# Patient Record
Sex: Female | Born: 1945 | Race: White | Hispanic: No | Smoking: Never smoker
Health system: Southern US, Community
[De-identification: ages and names within clinical notes are randomized; demographics above are authoritative.]

## PROBLEM LIST (undated history)

## (undated) DIAGNOSIS — I10 Essential (primary) hypertension: Secondary | ICD-10-CM

## (undated) DIAGNOSIS — G500 Trigeminal neuralgia: Secondary | ICD-10-CM

---

## 2001-06-02 ENCOUNTER — Encounter: Admission: RE | Admit: 2001-06-02 | Discharge: 2001-06-02 | Payer: Self-pay | Admitting: Family Medicine

## 2001-06-13 ENCOUNTER — Encounter: Admission: RE | Admit: 2001-06-13 | Discharge: 2001-06-13 | Payer: Self-pay | Admitting: Family Medicine

## 2001-06-30 ENCOUNTER — Encounter: Admission: RE | Admit: 2001-06-30 | Discharge: 2001-06-30 | Payer: Self-pay | Admitting: Family Medicine

## 2001-06-30 ENCOUNTER — Encounter: Payer: Self-pay | Admitting: Sports Medicine

## 2001-06-30 ENCOUNTER — Encounter: Admission: RE | Admit: 2001-06-30 | Discharge: 2001-06-30 | Payer: Self-pay | Admitting: Sports Medicine

## 2001-07-11 ENCOUNTER — Encounter: Admission: RE | Admit: 2001-07-11 | Discharge: 2001-07-11 | Payer: Self-pay | Admitting: Sports Medicine

## 2001-08-29 ENCOUNTER — Encounter: Admission: RE | Admit: 2001-08-29 | Discharge: 2001-08-29 | Payer: Self-pay | Admitting: Family Medicine

## 2001-09-22 ENCOUNTER — Encounter: Admission: RE | Admit: 2001-09-22 | Discharge: 2001-09-22 | Payer: Self-pay | Admitting: Sports Medicine

## 2001-11-04 ENCOUNTER — Encounter: Admission: RE | Admit: 2001-11-04 | Discharge: 2001-11-04 | Payer: Self-pay | Admitting: Family Medicine

## 2001-12-20 ENCOUNTER — Encounter: Admission: RE | Admit: 2001-12-20 | Discharge: 2001-12-20 | Payer: Self-pay | Admitting: Family Medicine

## 2002-06-23 ENCOUNTER — Encounter: Admission: RE | Admit: 2002-06-23 | Discharge: 2002-06-23 | Payer: Self-pay | Admitting: Family Medicine

## 2002-10-16 ENCOUNTER — Encounter: Admission: RE | Admit: 2002-10-16 | Discharge: 2002-10-16 | Payer: Self-pay | Admitting: Family Medicine

## 2002-11-12 ENCOUNTER — Emergency Department (HOSPITAL_COMMUNITY): Admission: EM | Admit: 2002-11-12 | Discharge: 2002-11-12 | Payer: Self-pay | Admitting: Emergency Medicine

## 2002-11-12 ENCOUNTER — Encounter: Payer: Self-pay | Admitting: Emergency Medicine

## 2002-11-30 ENCOUNTER — Encounter: Admission: RE | Admit: 2002-11-30 | Discharge: 2002-11-30 | Payer: Self-pay | Admitting: Sports Medicine

## 2002-11-30 ENCOUNTER — Ambulatory Visit (HOSPITAL_COMMUNITY): Admission: RE | Admit: 2002-11-30 | Discharge: 2002-11-30 | Payer: Self-pay | Admitting: Sports Medicine

## 2002-12-07 ENCOUNTER — Encounter: Admission: RE | Admit: 2002-12-07 | Discharge: 2002-12-07 | Payer: Self-pay | Admitting: Sports Medicine

## 2003-12-04 ENCOUNTER — Other Ambulatory Visit: Admission: RE | Admit: 2003-12-04 | Discharge: 2003-12-04 | Payer: Self-pay | Admitting: Internal Medicine

## 2004-01-18 ENCOUNTER — Encounter: Admission: RE | Admit: 2004-01-18 | Discharge: 2004-01-18 | Payer: Self-pay | Admitting: Internal Medicine

## 2005-12-15 ENCOUNTER — Encounter: Admission: RE | Admit: 2005-12-15 | Discharge: 2005-12-15 | Payer: Self-pay | Admitting: Internal Medicine

## 2006-01-07 ENCOUNTER — Other Ambulatory Visit: Admission: RE | Admit: 2006-01-07 | Discharge: 2006-01-07 | Payer: Self-pay | Admitting: Internal Medicine

## 2006-12-29 ENCOUNTER — Encounter: Admission: RE | Admit: 2006-12-29 | Discharge: 2006-12-29 | Payer: Self-pay | Admitting: Internal Medicine

## 2007-12-26 ENCOUNTER — Other Ambulatory Visit: Admission: RE | Admit: 2007-12-26 | Discharge: 2007-12-26 | Payer: Self-pay | Admitting: Internal Medicine

## 2008-01-04 ENCOUNTER — Encounter: Admission: RE | Admit: 2008-01-04 | Discharge: 2008-01-04 | Payer: Self-pay | Admitting: Internal Medicine

## 2008-09-20 ENCOUNTER — Observation Stay (HOSPITAL_COMMUNITY): Admission: EM | Admit: 2008-09-20 | Discharge: 2008-09-21 | Payer: Self-pay | Admitting: Emergency Medicine

## 2009-04-03 ENCOUNTER — Encounter: Admission: RE | Admit: 2009-04-03 | Discharge: 2009-04-03 | Payer: Self-pay | Admitting: Neurology

## 2009-05-02 ENCOUNTER — Encounter: Admission: RE | Admit: 2009-05-02 | Discharge: 2009-05-02 | Payer: Self-pay | Admitting: Internal Medicine

## 2009-06-10 ENCOUNTER — Encounter (INDEPENDENT_AMBULATORY_CARE_PROVIDER_SITE_OTHER): Payer: Self-pay | Admitting: General Surgery

## 2009-06-10 ENCOUNTER — Ambulatory Visit (HOSPITAL_COMMUNITY): Admission: RE | Admit: 2009-06-10 | Discharge: 2009-06-11 | Payer: Self-pay | Admitting: General Surgery

## 2009-09-28 ENCOUNTER — Ambulatory Visit (HOSPITAL_COMMUNITY): Admission: RE | Admit: 2009-09-28 | Discharge: 2009-09-28 | Payer: Self-pay | Admitting: Emergency Medicine

## 2009-09-28 ENCOUNTER — Ambulatory Visit: Payer: Self-pay | Admitting: Vascular Surgery

## 2009-09-28 ENCOUNTER — Encounter (INDEPENDENT_AMBULATORY_CARE_PROVIDER_SITE_OTHER): Payer: Self-pay | Admitting: Orthopaedic Surgery

## 2009-12-02 ENCOUNTER — Ambulatory Visit (HOSPITAL_BASED_OUTPATIENT_CLINIC_OR_DEPARTMENT_OTHER): Admission: RE | Admit: 2009-12-02 | Discharge: 2009-12-02 | Payer: Self-pay | Admitting: Orthopedic Surgery

## 2010-05-29 ENCOUNTER — Encounter: Admission: RE | Admit: 2010-05-29 | Discharge: 2010-05-29 | Payer: Self-pay | Admitting: Internal Medicine

## 2010-07-31 ENCOUNTER — Emergency Department (HOSPITAL_COMMUNITY): Admission: EM | Admit: 2010-07-31 | Discharge: 2009-10-10 | Payer: Self-pay | Admitting: Emergency Medicine

## 2010-11-12 LAB — POCT HEMOGLOBIN-HEMACUE: Hemoglobin: 13.1 g/dL (ref 12.0–15.0)

## 2010-11-12 LAB — BASIC METABOLIC PANEL
CO2: 29 mEq/L (ref 19–32)
Chloride: 97 mEq/L (ref 96–112)
GFR calc non Af Amer: >60 mL/min (ref >60)
Glucose, Bld: 102 mg/dL — ABNORMAL HIGH (ref 70–99)
Potassium: 4 mEq/L (ref 3.5–5.1)

## 2010-11-13 LAB — CBC
HCT: 40 % (ref 36.0–46.0)
Hemoglobin: 13.5 g/dL (ref 12.0–15.0)
MCHC: 33.8 g/dL (ref 30.0–36.0)
Platelets: 278 10*3/uL (ref 150–400)
RDW: 16.5 % — ABNORMAL HIGH (ref 11.5–15.5)

## 2010-11-13 LAB — POCT I-STAT, CHEM 8
BUN: 17 mg/dL (ref 6–23)
Creatinine, Ser: 0.6 mg/dL (ref 0.4–1.2)
Glucose, Bld: 123 mg/dL — ABNORMAL HIGH (ref 70–99)
HCT: 42 % (ref 36.0–46.0)
Potassium: 4.1 mEq/L (ref 3.5–5.1)

## 2010-11-13 LAB — DIFFERENTIAL
Basophils Absolute: 0.1 10*3/uL (ref 0.0–0.1)
Basophils Relative: 1 % (ref 0–1)
Eosinophils Absolute: 0.2 10*3/uL (ref 0.0–0.7)
Eosinophils Relative: 1 % (ref 0–5)
Lymphs Abs: 2 10*3/uL (ref 0.7–4.0)
Monocytes Absolute: 0.6 10*3/uL (ref 0.1–1.0)
Monocytes Relative: 4 % (ref 3–12)
Neutro Abs: 11.9 10*3/uL — ABNORMAL HIGH (ref 1.7–7.7)

## 2010-11-13 LAB — HEPATIC FUNCTION PANEL
AST: 63 U/L — ABNORMAL HIGH (ref 0–37)
Bilirubin, Direct: 0.1 mg/dL (ref 0.0–0.3)
Indirect Bilirubin: 0.6 mg/dL (ref 0.3–0.9)
Total Protein: 7.1 g/dL (ref 6.0–8.3)

## 2010-11-13 LAB — URINE CULTURE

## 2010-11-13 LAB — URINALYSIS, ROUTINE W REFLEX MICROSCOPIC
Hgb urine dipstick: NEGATIVE
Specific Gravity, Urine: 1.012 (ref 1.005–1.030)
pH: 7.5 (ref 5.0–8.0)

## 2010-11-13 LAB — URINE MICROSCOPIC-ADD ON

## 2010-11-27 LAB — URINALYSIS, ROUTINE W REFLEX MICROSCOPIC
Glucose, UA: NEGATIVE mg/dL
Ketones, ur: NEGATIVE mg/dL
Protein, ur: NEGATIVE mg/dL
Specific Gravity, Urine: 1.019 (ref 1.005–1.030)
Urobilinogen, UA: 1 mg/dL (ref 0.0–1.0)
pH: 6.5 (ref 5.0–8.0)

## 2010-11-27 LAB — COMPREHENSIVE METABOLIC PANEL
Albumin: 4 g/dL (ref 3.5–5.2)
Alkaline Phosphatase: 109 U/L (ref 39–117)
CO2: 28 mEq/L (ref 19–32)
Creatinine, Ser: 0.89 mg/dL (ref 0.4–1.2)
GFR calc non Af Amer: >60 mL/min (ref >60)
Total Protein: 7.1 g/dL (ref 6.0–8.3)

## 2010-11-27 LAB — DIFFERENTIAL
Basophils Absolute: 0.1 10*3/uL (ref 0.0–0.1)
Basophils Relative: 1 % (ref 0–1)
Eosinophils Absolute: 0.3 10*3/uL (ref 0.0–0.7)
Eosinophils Relative: 3 % (ref 0–5)
Lymphocytes Relative: 31 % (ref 12–46)
Lymphs Abs: 3.3 10*3/uL (ref 0.7–4.0)

## 2010-11-27 LAB — CBC
Hemoglobin: 13.3 g/dL (ref 12.0–15.0)
WBC: 10.4 10*3/uL (ref 4.0–10.5)

## 2010-11-27 LAB — URINE MICROSCOPIC-ADD ON

## 2010-12-08 LAB — CARDIAC PANEL(CRET KIN+CKTOT+MB+TROPI)
CK, MB: 1.9 ng/mL (ref 0.3–4.0)
CK, MB: 2.1 ng/mL (ref 0.3–4.0)
Relative Index: INVALID (ref 0.0–2.5)
Total CK: 59 U/L (ref 7–177)
Total CK: 64 U/L (ref 7–177)
Troponin I: 0.01 ng/mL (ref 0.00–0.06)

## 2010-12-08 LAB — CBC
HCT: 37.2 % (ref 36.0–46.0)
MCHC: 33.5 g/dL (ref 30.0–36.0)
Platelets: 276 10*3/uL (ref 150–400)
RDW: 15.7 % — ABNORMAL HIGH (ref 11.5–15.5)

## 2010-12-08 LAB — DIFFERENTIAL
Basophils Relative: 1 % (ref 0–1)
Lymphs Abs: 1.6 10*3/uL (ref 0.7–4.0)
Monocytes Absolute: 0.5 10*3/uL (ref 0.1–1.0)
Monocytes Relative: 6 % (ref 3–12)
Neutro Abs: 5.6 10*3/uL (ref 1.7–7.7)
Neutrophils Relative %: 69 % (ref 43–77)

## 2010-12-08 LAB — POCT CARDIAC MARKERS
CKMB, poc: <1 ng/mL — ABNORMAL LOW (ref 1.0–8.0)
Myoglobin, poc: 69.3 ng/mL (ref 12–200)
Troponin i, poc: <0.05 ng/mL (ref 0.00–0.09)
Troponin i, poc: <0.05 ng/mL (ref 0.00–0.09)

## 2010-12-08 LAB — COMPREHENSIVE METABOLIC PANEL
Albumin: 3.5 g/dL (ref 3.5–5.2)
Alkaline Phosphatase: 111 U/L (ref 39–117)
BUN: 12 mg/dL (ref 6–23)
Calcium: 9 mg/dL (ref 8.4–10.5)
Glucose, Bld: 139 mg/dL — ABNORMAL HIGH (ref 70–99)
Potassium: 4.4 mEq/L (ref 3.5–5.1)
Sodium: 138 mEq/L (ref 135–145)
Total Protein: 6.8 g/dL (ref 6.0–8.3)

## 2010-12-08 LAB — LIPASE, BLOOD: Lipase: 32 U/L (ref 11–59)

## 2011-01-06 NOTE — H&P (Signed)
NAME:  Shelley Barrera, Shelley Barrera NO.:  [id number]   MEDICAL RECORD NO.:  [id number]          PATIENT TYPE:  EMS   LOCATION:  [facility]                         FACILITY:  [facility]   PHYSICIAN:  Hollice Espy, M.D.DATE OF BIRTH:  27-Mar-1946   DATE OF ADMISSION:  09/20/2008  DATE OF DISCHARGE:                              HISTORY & PHYSICAL   The patient's PCP is Dr. Georgann Housekeeper.   CONSULTANTS:  [facility].   CHIEF COMPLAINTS:  Chest pain.   HISTORY OF PRESENT ILLNESS:  This patient is a 65 year old white female  with a past medical history of hypertension and depression who had been  complaining of previous episodes of chest pain and had been seen by her  PCP. At that time she had been scheduled for an outpatient stress test.  She completed part 1 of her nuclear stress test on September 19, 2008.  However, in the interim she had plans for part 2 of her stress test to  be on September 21, 2008, but this part was rescheduled secondary to  possible weather conditions. In the interim she developed some chest  pain, left-sided with some radiation to her back, associated shortness  of breath and weakness.  She became concerned and came into the  emergency room. In the emergency room she was noted to have normal  cardiac markers, normal EKG, negative D-dimer, normal lab values. Chest  x-ray done showed cardiomegaly but no active lung disease.  The patient  was given aspirin and nitroglycerin.  She says her pain is intermittent  although now it is more on the right side, and she felt that perhaps it  was brought on by eating a sandwich.  She denies any headaches, vision  changes, dysphagia. No palpitations or shortness breath, wheeze, cough,  abdominal pain, hematuria, dysuria, constipation, diarrhea, focal  extremity numbness, weakness, or pain.   REVIEW OF SYSTEMS:  Otherwise negative.   The patient's past medical history includes seasonal rhinitis,  depression,  hypertension.   MEDICATIONS:  She is on Diovan 160, Cymbalta 60, Wellbutrin 300, Zyrtec  10, and Trileptal.   ALLERGIES:  She has allergies to DEMEROL.   SOCIAL HISTORY:  She denies any tobacco, alcohol or drug use.   FAMILY HISTORY:  Noncontributory.   PHYSICAL EXAMINATION:  VITALS:  On admission temperature 97.9, heart  rate 82, blood pressure 149/70, respirations 16, O2 sat 100% on 2  liters.  GENERAL:  She is alert and oriented x3, in no apparent distress.  HEENT:  Normocephalic, atraumatic.  Mucous membranes are moist.  She has  no carotid bruits.  HEART:  Regular rate and rhythm, S1 and S2 with 2/6 systolic ejection  murmur.  LUNGS:  Clear to auscultation bilaterally.  ABDOMEN:  Soft, nontender, nondistended.  Positive bowel sounds.  EXTREMITIES:  No clubbing, cyanosis or edema.   LABS:  Chest x-ray is as per HPI.  White count 8.1, H and H 12.4 and  37.2, MCV 82, platelet count 276, no shift. Sodium 138, potassium 4.4,  chloride 102, bicarb 29, BUN 12, creatinine 0.9, glucose 139.  LFTs are  unremarkable.  Lipase 32, D-dimer 0.31.  CPK 69.3, MB less than 1,  troponin I 0.05, second set is similar.   ASSESSMENT AND PLAN:  1. Chest pain.  I have discussed this with [facility]. The      patient's first part of her stress test is not yet read. They are      recommending to keep the patient overnight for 24-hour observation,      enzymes x3, and in the morning contact [facility] office and      get the first portion of her stress test read and reported.  If it      is negative the patient can be discharged home with actually no      need for the second part of her stress test done.  2. Depression.  Continue medications.  3. Hypertension.  Continue medicines.      Hollice Espy, M.D.  Electronically Signed     SKK/MEDQ  D:  09/20/2008  T:  09/20/2008  Job:  [id number]   cc:   Georgann Housekeeper, MD

## 2011-04-05 ENCOUNTER — Emergency Department (HOSPITAL_COMMUNITY)
Admission: EM | Admit: 2011-04-05 | Discharge: 2011-04-05 | Disposition: A | Discharge status: Home / Self Care | Payer: BC Managed Care – PPO | Attending: Emergency Medicine | Admitting: Emergency Medicine

## 2011-04-05 DIAGNOSIS — I951 Orthostatic hypotension: Secondary | ICD-10-CM | POA: Insufficient documentation

## 2011-04-05 DIAGNOSIS — K219 Gastro-esophageal reflux disease without esophagitis: Secondary | ICD-10-CM | POA: Insufficient documentation

## 2011-04-05 DIAGNOSIS — F329 Major depressive disorder, single episode, unspecified: Secondary | ICD-10-CM | POA: Insufficient documentation

## 2011-04-05 DIAGNOSIS — I10 Essential (primary) hypertension: Secondary | ICD-10-CM | POA: Insufficient documentation

## 2011-04-05 DIAGNOSIS — N390 Urinary tract infection, site not specified: Secondary | ICD-10-CM | POA: Insufficient documentation

## 2011-04-05 DIAGNOSIS — Z79899 Other long term (current) drug therapy: Secondary | ICD-10-CM | POA: Insufficient documentation

## 2011-04-05 DIAGNOSIS — F3289 Other specified depressive episodes: Secondary | ICD-10-CM | POA: Insufficient documentation

## 2011-04-05 LAB — CBC
HCT: 37.8 % (ref 36.0–46.0)
MCHC: 32.5 g/dL (ref 30.0–36.0)
Platelets: 238 10*3/uL (ref 150–400)
RDW: 14.8 % (ref 11.5–15.5)
WBC: 7.8 10*3/uL (ref 4.0–10.5)

## 2011-04-05 LAB — URINALYSIS, ROUTINE W REFLEX MICROSCOPIC
Bilirubin Urine: NEGATIVE
Nitrite: POSITIVE — AB
Protein, ur: NEGATIVE mg/dL
Urobilinogen, UA: 0.2 mg/dL (ref 0.0–1.0)

## 2011-04-05 LAB — BASIC METABOLIC PANEL
BUN: 11 mg/dL (ref 6–23)
CO2: 29 mEq/L (ref 19–32)
Glucose, Bld: 80 mg/dL (ref 70–99)
Potassium: 4.1 mEq/L (ref 3.5–5.1)
Sodium: 135 mEq/L (ref 135–145)

## 2011-04-05 LAB — URINE MICROSCOPIC-ADD ON

## 2011-04-05 LAB — DIFFERENTIAL
Basophils Absolute: 0 10*3/uL (ref 0.0–0.1)
Eosinophils Absolute: 0.1 10*3/uL (ref 0.0–0.7)
Eosinophils Relative: 1 % (ref 0–5)
Lymphocytes Relative: 20 % (ref 12–46)
Monocytes Absolute: 0.6 10*3/uL (ref 0.1–1.0)

## 2011-04-07 LAB — URINE CULTURE: Colony Count: >=100000

## 2011-05-27 ENCOUNTER — Other Ambulatory Visit: Payer: Self-pay | Admitting: Internal Medicine

## 2011-05-27 DIAGNOSIS — R1031 Right lower quadrant pain: Secondary | ICD-10-CM

## 2011-05-29 ENCOUNTER — Ambulatory Visit
Admission: RE | Admit: 2011-05-29 | Discharge: 2011-05-29 | Disposition: A | Discharge status: Home / Self Care | Payer: BC Managed Care – PPO | Source: Ambulatory Visit | Attending: Internal Medicine | Admitting: Internal Medicine

## 2011-05-29 DIAGNOSIS — R1031 Right lower quadrant pain: Secondary | ICD-10-CM

## 2011-05-29 MED ORDER — IOHEXOL 300 MG/ML  SOLN
125.0000 mL | Freq: Once | INTRAMUSCULAR | Status: AC | PRN
  Administered 2011-05-29: 125 mL via INTRAVENOUS

## 2011-06-03 ENCOUNTER — Other Ambulatory Visit: Payer: Self-pay | Admitting: Internal Medicine

## 2011-06-03 DIAGNOSIS — Z1231 Encounter for screening mammogram for malignant neoplasm of breast: Secondary | ICD-10-CM

## 2011-06-23 ENCOUNTER — Ambulatory Visit: Payer: BC Managed Care – PPO

## 2011-07-13 ENCOUNTER — Other Ambulatory Visit (HOSPITAL_COMMUNITY)
Admission: RE | Admit: 2011-07-13 | Discharge: 2011-07-13 | Disposition: A | Discharge status: Home / Self Care | Payer: BC Managed Care – PPO | Source: Ambulatory Visit | Attending: Obstetrics and Gynecology | Admitting: Obstetrics and Gynecology

## 2011-07-13 ENCOUNTER — Other Ambulatory Visit: Payer: Self-pay | Admitting: Obstetrics and Gynecology

## 2011-07-13 DIAGNOSIS — Z01419 Encounter for gynecological examination (general) (routine) without abnormal findings: Secondary | ICD-10-CM | POA: Insufficient documentation

## 2011-07-13 DIAGNOSIS — Z113 Encounter for screening for infections with a predominantly sexual mode of transmission: Secondary | ICD-10-CM | POA: Insufficient documentation

## 2011-08-02 IMAGING — US US ABDOMEN COMPLETE
1 series · 14 of 25 positions shown · non-contrast
Comparison: None.

CLINICAL DATA: Pancreatitis, right upper quadrant pain.

COMPLETE ABDOMINAL ULTRASOUND

[Series 1: us abdomen complete · 0.45mm/px · 14 of 84 slices shown]
[im 1/84]
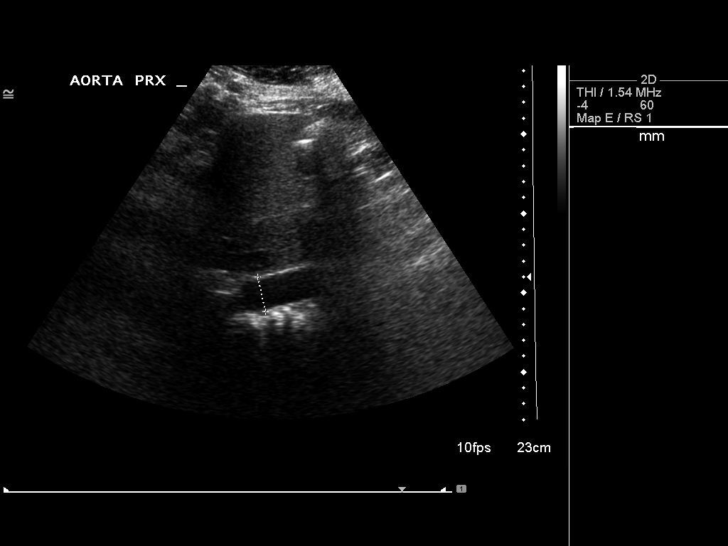
[im 7/84]
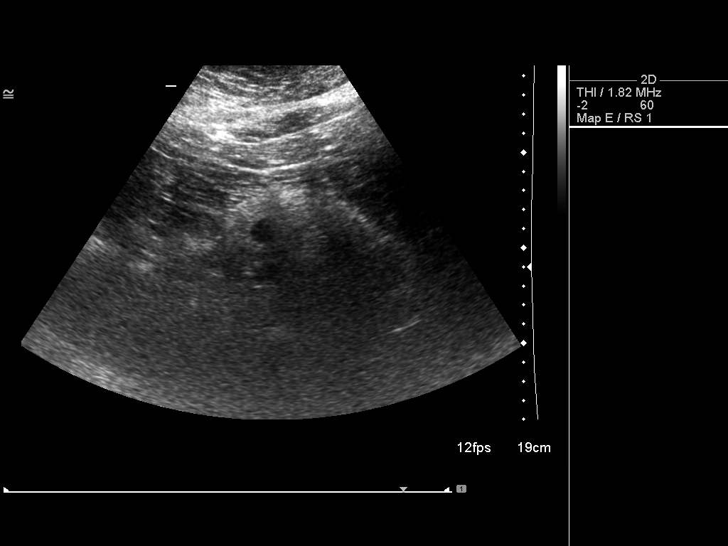
[im 14/84]
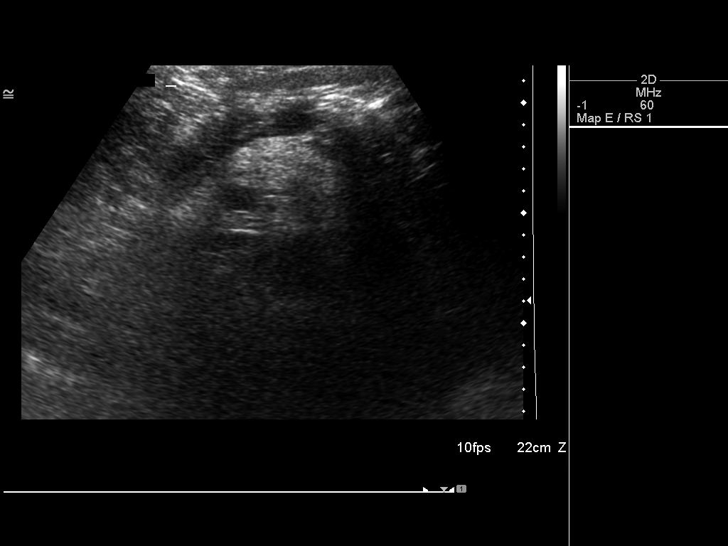
[im 21/84]
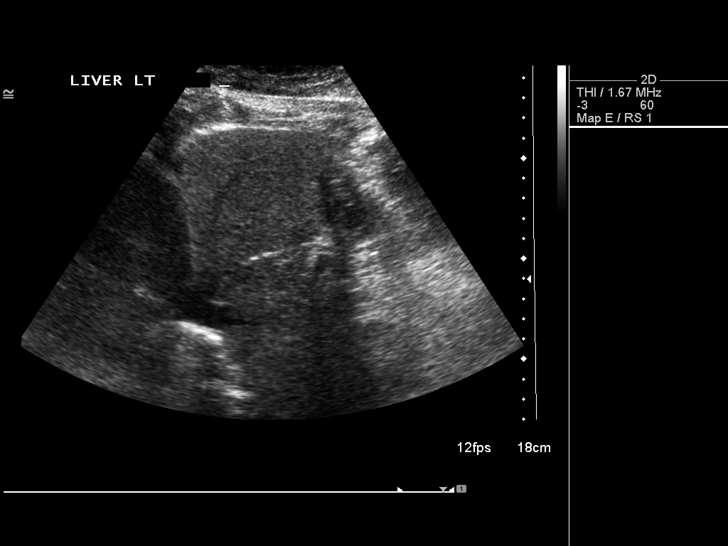
[im 28/84]
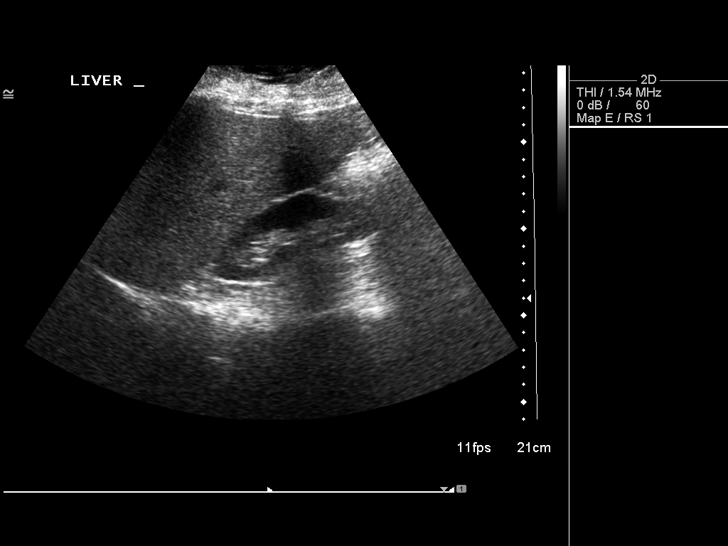
[im 32/84]
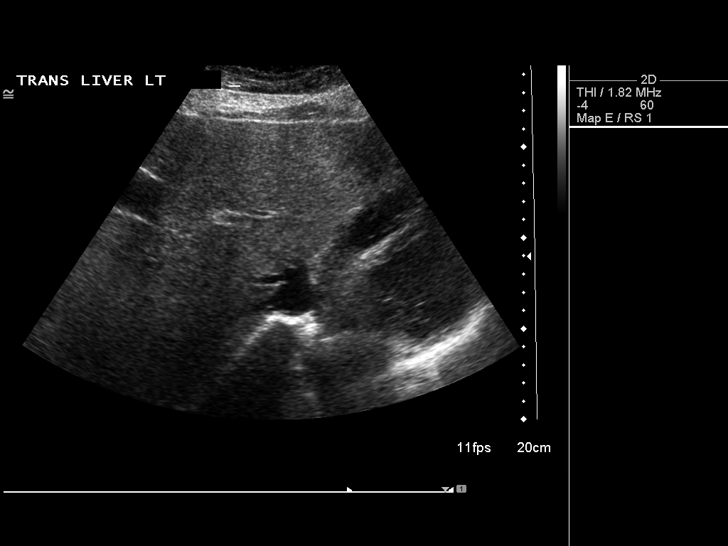
[im 39/84]
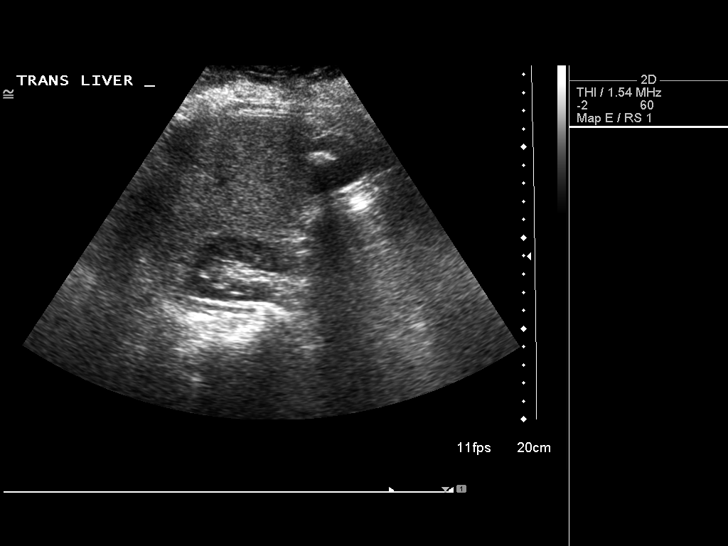
[im 45/84]
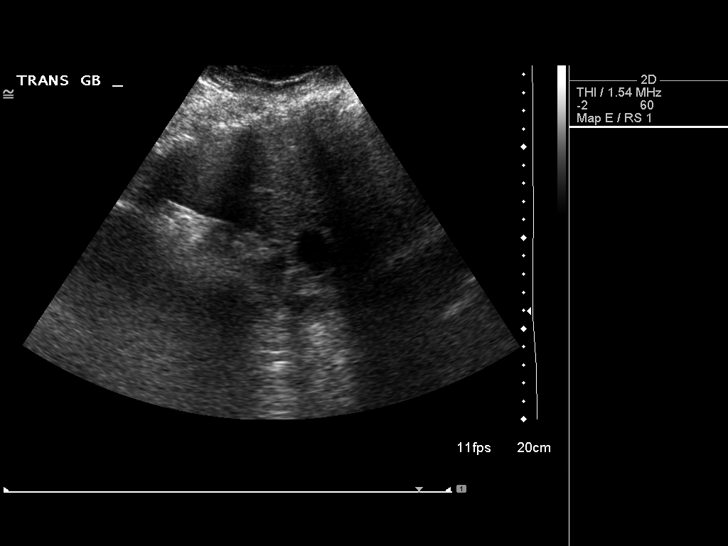
[im 52/84]
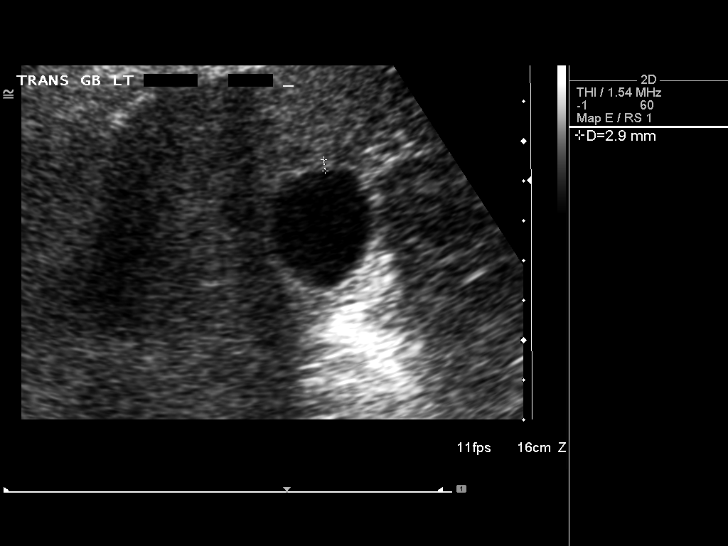
[im 56/84]
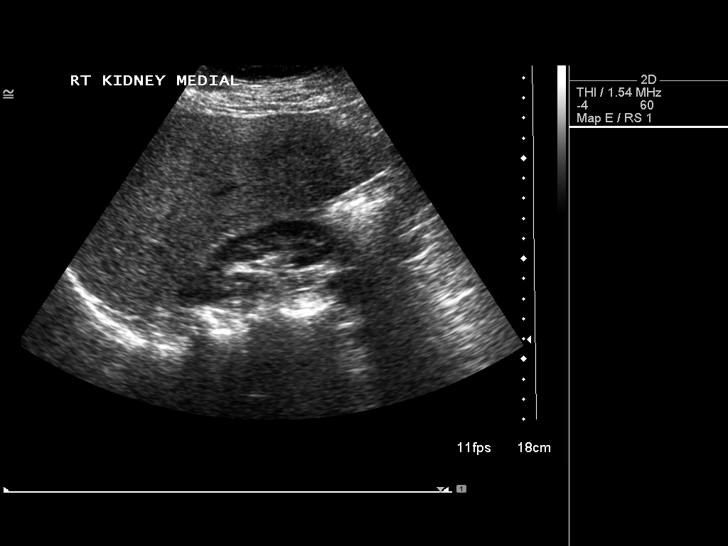
[im 63/84]
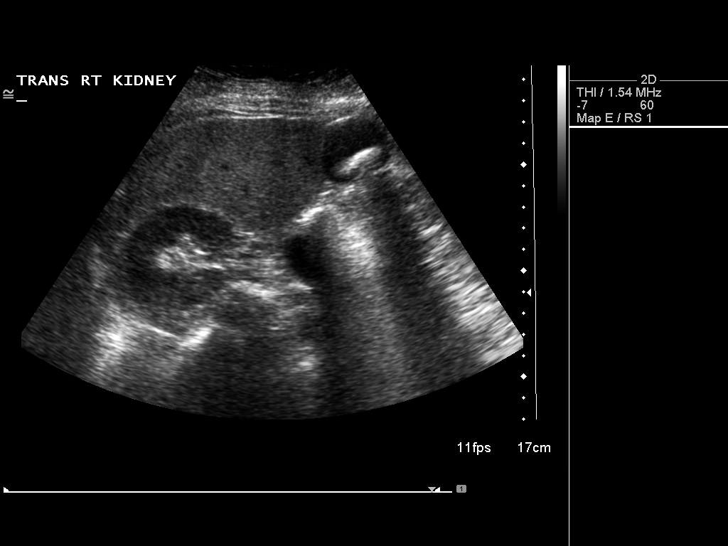
[im 70/84]
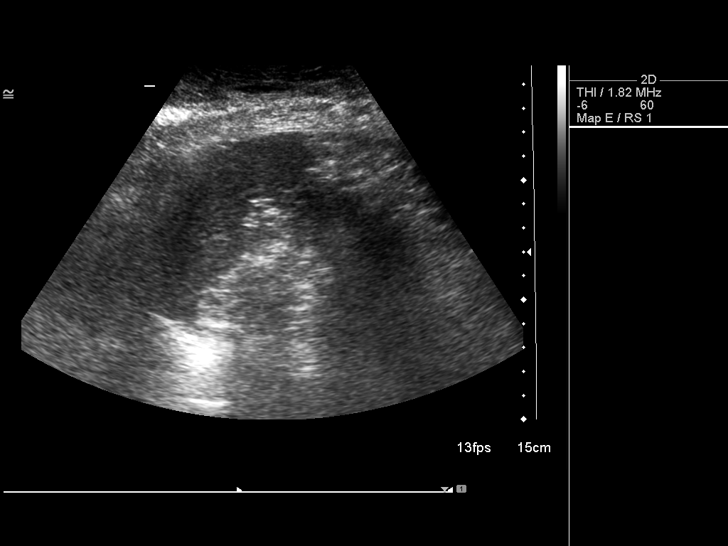
[im 77/84]
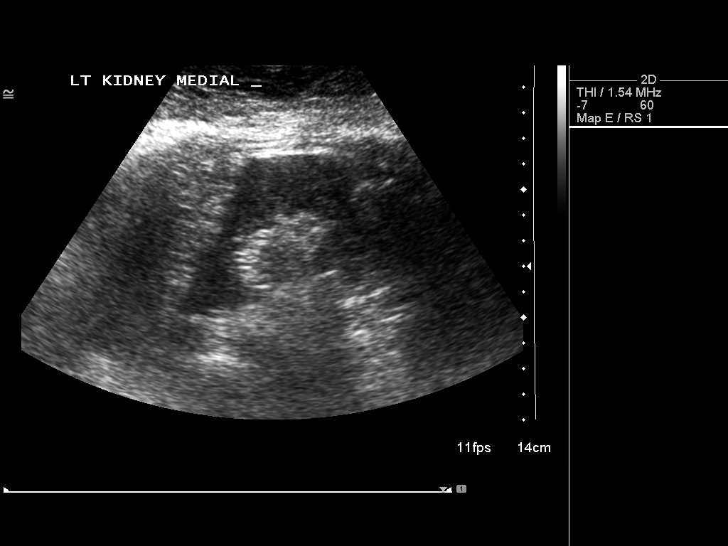
[im 84/84]
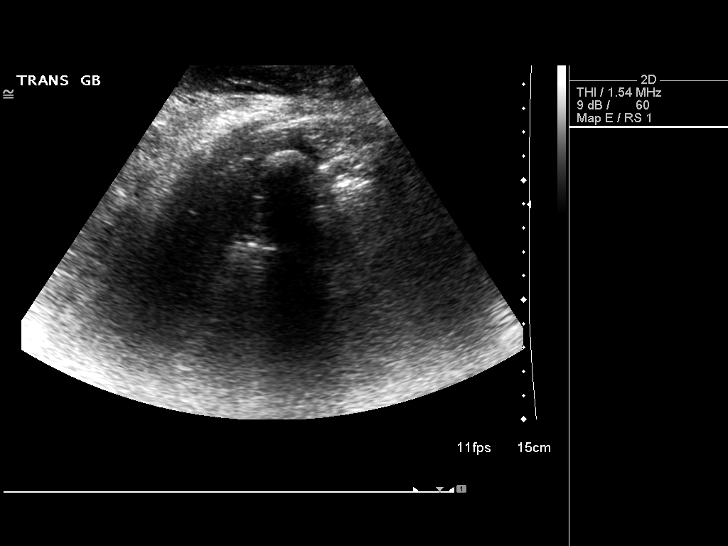

[14 of 25 positions shown; findings below may reference images not displayed]

FINDINGS: Gallbladder:  Shadowing echogenic lesions are seen in the
gallbladder, with the largest measuring 2.9 cm.  Gallbladder wall
measures 3 mm.  Positive sonographic Murphy's sign is present.

Common bile duct:  3 mm, within normal limits.

Liver:  Slightly increased in echogenicity diffusely.

IVC:  Visualized.

Pancreas:  Visualization is suboptimal due to bowel gas.

Spleen:  11.1 cm, negative.

Right Kidney:  9.9 cm, negative.

Left Kidney:  5.9 cm.  Contains a 7 mm echogenic lesion in the
upper pole, which does not show posterior acoustic shadowing.

Abdominal aorta:  2.3 cm.
IMPRESSION: 1.  Cholelithiasis.  No wall thickening.  Sonographic Murphy's sign
suggests the possibility of acute cholecystitis.  Please correlate
clinically.
2.  Fatty liver.
3.  Possible small angiomyolipoma in the left kidney.  A
nonshadowing renal stone is another possibility.

## 2011-08-18 ENCOUNTER — Emergency Department (HOSPITAL_COMMUNITY)
Admission: EM | Admit: 2011-08-18 | Discharge: 2011-08-18 | Disposition: A | Discharge status: Home / Self Care | Payer: BC Managed Care – PPO | Attending: Emergency Medicine | Admitting: Emergency Medicine

## 2011-08-18 ENCOUNTER — Encounter: Payer: Self-pay | Admitting: *Deleted

## 2011-08-18 DIAGNOSIS — R599 Enlarged lymph nodes, unspecified: Secondary | ICD-10-CM | POA: Insufficient documentation

## 2011-08-18 DIAGNOSIS — H9209 Otalgia, unspecified ear: Secondary | ICD-10-CM | POA: Insufficient documentation

## 2011-08-18 DIAGNOSIS — Z79899 Other long term (current) drug therapy: Secondary | ICD-10-CM | POA: Insufficient documentation

## 2011-08-18 DIAGNOSIS — I10 Essential (primary) hypertension: Secondary | ICD-10-CM | POA: Insufficient documentation

## 2011-08-18 DIAGNOSIS — R220 Localized swelling, mass and lump, head: Secondary | ICD-10-CM | POA: Insufficient documentation

## 2011-08-18 DIAGNOSIS — J029 Acute pharyngitis, unspecified: Secondary | ICD-10-CM | POA: Insufficient documentation

## 2011-08-18 HISTORY — DX: Essential (primary) hypertension: I10

## 2011-08-18 HISTORY — DX: Trigeminal neuralgia: G50.0

## 2011-08-18 NOTE — ED Provider Notes (Signed)
History     CSN: [id number]  Arrival date & time 08/18/11  1021   First MD Initiated Contact with Patient 08/18/11 1045      Chief Complaint  Patient presents with  . Sore Throat    (Consider location/radiation/quality/duration/timing/severity/associated sxs/prior treatment) HPI Comments: Patient reports that she has had left-sided sore throat since yesterday.  She in addition has some left-sided ear pain that started this morning.  She denies fevers, night sweats, chills, shortness of breath, cough, congestion, sinus pressure.  Patient has no other complaints.  Patient is a 65 y.o. female presenting with pharyngitis. The history is provided by the patient.  Sore Throat This is a new problem. The current episode started yesterday. The problem occurs constantly. The problem has been unchanged. Associated symptoms include a sore throat. Pertinent negatives include no abdominal pain, anorexia, arthralgias, change in bowel habit, chest pain, chills, congestion, coughing, diaphoresis, fatigue, fever, headaches, joint swelling, myalgias, nausea, neck pain, numbness, rash, swollen glands, urinary symptoms, vertigo, visual change, vomiting or weakness. The symptoms are aggravated by eating, drinking and swallowing. She has tried nothing for the symptoms.    Past Medical History  Diagnosis Date  . Hypertension   . Trigeminal neuralgia     History reviewed. No pertinent past surgical history.  History reviewed. No pertinent family history.  History  Substance Use Topics  . Smoking status: Never Smoker   . Smokeless tobacco: Not on file  . Alcohol Use: No    OB History    Grav Para Term Preterm Abortions TAB SAB Ect Mult Living                  Review of Systems  Constitutional: Negative for fever, chills, diaphoresis and fatigue.  HENT: Positive for ear pain and sore throat. Negative for hearing loss, congestion, drooling, neck pain, neck stiffness, tinnitus and ear discharge.     Respiratory: Negative for cough.   Cardiovascular: Negative for chest pain.  Gastrointestinal: Negative for nausea, vomiting, abdominal pain, anorexia and change in bowel habit.  Musculoskeletal: Negative for myalgias, joint swelling and arthralgias.  Skin: Negative for rash.  Neurological: Negative for vertigo, weakness, numbness and headaches.    Allergies  Demerol  Home Medications   Current Outpatient Rx  Name Route Sig Dispense Refill  . BUPROPION HCL 100 MG PO TABS Oral Take 100 mg by mouth 2 (two) times daily.      Marland Kitchen CETIRIZINE HCL 10 MG PO TABS Oral Take 10 mg by mouth daily.      . DULOXETINE HCL 60 MG PO CPEP Oral Take 60 mg by mouth daily.      Marland Kitchen LANSOPRAZOLE 30 MG PO CPDR Oral Take 30 mg by mouth daily.      . MELOXICAM 15 MG PO TABS Oral Take 15 mg by mouth daily as needed. For pain     . OXCARBAZEPINE 300 MG PO TABS Oral Take 300 mg by mouth 2 (two) times daily.      Marland Kitchen VALSARTAN 80 MG PO TABS Oral Take 80 mg by mouth daily.        BP 129/50  Pulse 85  Temp(Src) 98.4 F (36.9 C) (Oral)  Resp 18  SpO2 94%  Physical Exam  Constitutional: She is oriented to person, place, and time. She appears well-developed and well-nourished. No distress.  HENT:  Head: Normocephalic and atraumatic. No trismus in the jaw.  Right Ear: Tympanic membrane, external ear and ear canal normal.  Ears:  Nose: Nose normal. No rhinorrhea. Right sinus exhibits no maxillary sinus tenderness and no frontal sinus tenderness. Left sinus exhibits no maxillary sinus tenderness and no frontal sinus tenderness.  Mouth/Throat: Uvula is midline and mucous membranes are normal. Normal dentition. No dental abscesses or uvula swelling. Oropharyngeal exudate and posterior oropharyngeal edema present. No posterior oropharyngeal erythema or tonsillar abscesses.  Neck: Trachea normal, normal range of motion and full passive range of motion without pain. Neck supple. No rigidity. Normal range of motion  present. No Brudzinski's sign noted.  Cardiovascular: Normal rate and regular rhythm.   Pulmonary/Chest: Effort normal and breath sounds normal.  Abdominal: Soft.  Musculoskeletal: Normal range of motion.  Lymphadenopathy:       Head (right side): No preauricular and no posterior auricular adenopathy present.       Head (left side): No preauricular and no posterior auricular adenopathy present.    She has cervical adenopathy.  Neurological: She is alert and oriented to person, place, and time.  Skin: Skin is warm and dry. She is not diaphoretic.  Psychiatric: She has a normal mood and affect.    ED Course  Procedures (including critical care time)   Labs Reviewed  RAPID STREP SCREEN   No results found.   No diagnosis found. Pt strep negative. Pt left TM re-evaluated after cerumen removed; normal TM and canal. Pt being dc w recommendations to fu w PCP for viral pharyngitis    MDM  Viral pharyngitis         Jaci Carrel, [place] 08/18/11 1234

## 2011-08-18 NOTE — ED Notes (Signed)
Reports left side sore throat that started yesterday. Airway intact at triage.

## 2011-08-21 NOTE — ED Provider Notes (Signed)
Medical screening examination/treatment/procedure(s) were performed by non-physician practitioner and as supervising physician I was immediately available for consultation/collaboration.   Celene Kras, MD 08/21/11 [phone]

## 2012-04-27 ENCOUNTER — Ambulatory Visit: Payer: BC Managed Care – PPO

## 2012-04-27 ENCOUNTER — Ambulatory Visit
Admission: RE | Admit: 2012-04-27 | Discharge: 2012-04-27 | Disposition: A | Discharge status: Home / Self Care | Payer: BC Managed Care – PPO | Source: Ambulatory Visit | Attending: Internal Medicine | Admitting: Internal Medicine

## 2012-04-27 DIAGNOSIS — Z1231 Encounter for screening mammogram for malignant neoplasm of breast: Secondary | ICD-10-CM

## 2013-05-10 ENCOUNTER — Other Ambulatory Visit: Payer: Self-pay | Admitting: Gastroenterology

## 2013-05-10 DIAGNOSIS — Z86010 Personal history of colonic polyps: Secondary | ICD-10-CM | POA: Diagnosis not present

## 2013-05-10 DIAGNOSIS — K5730 Diverticulosis of large intestine without perforation or abscess without bleeding: Secondary | ICD-10-CM | POA: Diagnosis not present

## 2013-05-10 DIAGNOSIS — Z09 Encounter for follow-up examination after completed treatment for conditions other than malignant neoplasm: Secondary | ICD-10-CM | POA: Diagnosis not present

## 2013-05-10 DIAGNOSIS — D126 Benign neoplasm of colon, unspecified: Secondary | ICD-10-CM | POA: Diagnosis not present

## 2013-06-03 DIAGNOSIS — J069 Acute upper respiratory infection, unspecified: Secondary | ICD-10-CM | POA: Diagnosis not present

## 2013-06-26 ENCOUNTER — Other Ambulatory Visit: Payer: Self-pay | Admitting: Internal Medicine

## 2013-06-26 DIAGNOSIS — G500 Trigeminal neuralgia: Secondary | ICD-10-CM | POA: Diagnosis not present

## 2013-06-26 DIAGNOSIS — E669 Obesity, unspecified: Secondary | ICD-10-CM | POA: Diagnosis not present

## 2013-06-26 DIAGNOSIS — I10 Essential (primary) hypertension: Secondary | ICD-10-CM | POA: Diagnosis not present

## 2013-06-26 DIAGNOSIS — Z1231 Encounter for screening mammogram for malignant neoplasm of breast: Secondary | ICD-10-CM

## 2013-06-26 DIAGNOSIS — R7989 Other specified abnormal findings of blood chemistry: Secondary | ICD-10-CM | POA: Diagnosis not present

## 2013-06-26 DIAGNOSIS — Z1331 Encounter for screening for depression: Secondary | ICD-10-CM | POA: Diagnosis not present

## 2013-06-26 DIAGNOSIS — Z0000 Encounter for general adult medical examination without abnormal findings: Secondary | ICD-10-CM | POA: Diagnosis not present

## 2013-06-26 DIAGNOSIS — K219 Gastro-esophageal reflux disease without esophagitis: Secondary | ICD-10-CM | POA: Diagnosis not present

## 2013-06-26 DIAGNOSIS — Z23 Encounter for immunization: Secondary | ICD-10-CM | POA: Diagnosis not present

## 2013-06-26 DIAGNOSIS — E782 Mixed hyperlipidemia: Secondary | ICD-10-CM | POA: Diagnosis not present

## 2013-06-26 DIAGNOSIS — F329 Major depressive disorder, single episode, unspecified: Secondary | ICD-10-CM | POA: Diagnosis not present

## 2013-06-28 ENCOUNTER — Ambulatory Visit
Admission: RE | Admit: 2013-06-28 | Discharge: 2013-06-28 | Disposition: A | Discharge status: Home / Self Care | Source: Ambulatory Visit | Attending: Internal Medicine | Admitting: Internal Medicine

## 2013-06-28 DIAGNOSIS — Z1231 Encounter for screening mammogram for malignant neoplasm of breast: Secondary | ICD-10-CM | POA: Diagnosis not present

## 2013-08-22 DIAGNOSIS — N390 Urinary tract infection, site not specified: Secondary | ICD-10-CM | POA: Diagnosis not present

## 2013-08-22 DIAGNOSIS — K5900 Constipation, unspecified: Secondary | ICD-10-CM | POA: Diagnosis not present

## 2013-08-22 DIAGNOSIS — R109 Unspecified abdominal pain: Secondary | ICD-10-CM | POA: Diagnosis not present

## 2013-09-06 DIAGNOSIS — L570 Actinic keratosis: Secondary | ICD-10-CM | POA: Diagnosis not present

## 2013-09-06 DIAGNOSIS — D230 Other benign neoplasm of skin of lip: Secondary | ICD-10-CM | POA: Diagnosis not present

## 2013-09-06 DIAGNOSIS — Z85828 Personal history of other malignant neoplasm of skin: Secondary | ICD-10-CM | POA: Diagnosis not present

## 2013-09-06 DIAGNOSIS — L820 Inflamed seborrheic keratosis: Secondary | ICD-10-CM | POA: Diagnosis not present

## 2013-09-06 DIAGNOSIS — L821 Other seborrheic keratosis: Secondary | ICD-10-CM | POA: Diagnosis not present

## 2013-09-22 DIAGNOSIS — R109 Unspecified abdominal pain: Secondary | ICD-10-CM | POA: Diagnosis not present

## 2013-09-22 DIAGNOSIS — N3020 Other chronic cystitis without hematuria: Secondary | ICD-10-CM | POA: Diagnosis not present

## 2013-10-01 ENCOUNTER — Emergency Department (HOSPITAL_COMMUNITY)
Admission: EM | Admit: 2013-10-01 | Discharge: 2013-10-01 | Disposition: A | Discharge status: Home / Self Care | Payer: Medicare Other | Attending: Emergency Medicine | Admitting: Emergency Medicine

## 2013-10-01 ENCOUNTER — Encounter (HOSPITAL_COMMUNITY): Payer: Self-pay | Admitting: Emergency Medicine

## 2013-10-01 ENCOUNTER — Emergency Department (HOSPITAL_COMMUNITY): Payer: Medicare Other

## 2013-10-01 DIAGNOSIS — Z880 Allergy status to penicillin: Secondary | ICD-10-CM | POA: Diagnosis not present

## 2013-10-01 DIAGNOSIS — M47817 Spondylosis without myelopathy or radiculopathy, lumbosacral region: Secondary | ICD-10-CM | POA: Diagnosis not present

## 2013-10-01 DIAGNOSIS — I10 Essential (primary) hypertension: Secondary | ICD-10-CM | POA: Insufficient documentation

## 2013-10-01 DIAGNOSIS — M519 Unspecified thoracic, thoracolumbar and lumbosacral intervertebral disc disorder: Secondary | ICD-10-CM | POA: Insufficient documentation

## 2013-10-01 DIAGNOSIS — M5137 Other intervertebral disc degeneration, lumbosacral region: Secondary | ICD-10-CM | POA: Diagnosis not present

## 2013-10-01 DIAGNOSIS — Z79899 Other long term (current) drug therapy: Secondary | ICD-10-CM | POA: Diagnosis not present

## 2013-10-01 DIAGNOSIS — G500 Trigeminal neuralgia: Secondary | ICD-10-CM | POA: Diagnosis not present

## 2013-10-01 DIAGNOSIS — R9431 Abnormal electrocardiogram [ECG] [EKG]: Secondary | ICD-10-CM | POA: Diagnosis not present

## 2013-10-01 DIAGNOSIS — M5136 Other intervertebral disc degeneration, lumbar region: Secondary | ICD-10-CM

## 2013-10-01 MED ORDER — HYDROCODONE-ACETAMINOPHEN 5-325 MG PO TABS: 1.0000 | ORAL_TABLET | ORAL | Status: DC | PRN

## 2013-10-01 NOTE — Discharge Instructions (Signed)

## 2013-10-01 NOTE — ED Notes (Signed)
Patient transported to MRI 

## 2013-10-01 NOTE — ED Notes (Signed)
Pt. Stated, I started having left leg numbness below the knee since this morning.  I  Was walking like i was drunk.  I also have had 2 ruptured disc and i continuously have back pain so Im not sure if they are related.

## 2013-10-01 NOTE — ED Provider Notes (Signed)
CSN: [id number]     Arrival date & time 10/01/13  0913 History   First MD Initiated Contact with Patient 10/01/13 1019     Chief Complaint  Patient presents with  . Numbness    HPI Pt has been having trouble with lower back pain for an extended period of time.  She has known history of herniated discs that many years ago was treated with traction.  She was seen by a spine doctor and was told that surgery would be very complicated and non operative care was recommended.  Pt states today she woke up with numbness in her lower leg.  It is primarily on the outer aspect below the knee.   She denies bowel or bladder incontinence.  No trouble with speech, headaache, double vision, arm symptoms or other complaints.  Past Medical History  Diagnosis Date  . Hypertension   . Trigeminal neuralgia    History reviewed. No pertinent past surgical history. No family history on file. History  Substance Use Topics  . Smoking status: Never Smoker   . Smokeless tobacco: Not on file  . Alcohol Use: No   OB History   Grav Para Term Preterm Abortions TAB SAB Ect Mult Living                 Review of Systems  All other systems reviewed and are negative.    Allergies  Demerol  Home Medications   Current Outpatient Rx  Name  Route  Sig  Dispense  Refill  . buPROPion (WELLBUTRIN) 100 MG tablet   Oral   Take 100 mg by mouth 2 (two) times daily.           . cetirizine (ZYRTEC) 10 MG tablet   Oral   Take 10 mg by mouth daily.           . DULoxetine (CYMBALTA) 60 MG capsule   Oral   Take 60 mg by mouth daily.           . lansoprazole (PREVACID) 30 MG capsule   Oral   Take 30 mg by mouth daily.           . meloxicam (MOBIC) 15 MG tablet   Oral   Take 15 mg by mouth daily as needed. For pain          . Oxcarbazepine (TRILEPTAL) 300 MG tablet   Oral   Take 300 mg by mouth 2 (two) times daily.           . valsartan (DIOVAN) 80 MG tablet   Oral   Take 80 mg by mouth daily.             BP 151/79  Pulse 84  Temp(Src) 97.8 F (36.6 C) (Oral)  Resp 20  Ht 5\' 3"  (1.6 m)  Wt 200 lb (90.719 kg)  BMI 35.44 kg/m2  SpO2 99% Physical Exam  Nursing note and vitals reviewed. Constitutional: She is oriented to person, place, and time. She appears well-developed and well-nourished. No distress.  HENT:  Head: Normocephalic and atraumatic.  Right Ear: External ear normal.  Left Ear: External ear normal.  Mouth/Throat: Oropharynx is clear and moist.  Eyes: Conjunctivae are normal. Right eye exhibits no discharge. Left eye exhibits no discharge. No scleral icterus.  Neck: Neck supple. No tracheal deviation present.  Cardiovascular: Normal rate, regular rhythm and intact distal pulses.   Pulmonary/Chest: Effort normal and breath sounds normal. No stridor. No respiratory distress. She has no wheezes.  She has no rales.  Abdominal: Soft. Bowel sounds are normal. She exhibits no distension. There is no tenderness. There is no rebound and no guarding.  Musculoskeletal: She exhibits no edema.       Lumbar back: She exhibits tenderness and bony tenderness. She exhibits no swelling, no edema and no deformity.  Neurological: She is alert and oriented to person, place, and time. She has normal strength. A sensory deficit is present. No cranial nerve deficit (No facial droop, extraocular movements intact, tongue midline ). She exhibits normal muscle tone. She displays no seizure activity. Coordination normal.  No pronator drift bilateral upper extrem, able to hold both legs off bed for 5 seconds, sensation intact in all extremities, no visual field cuts, no left or right sided neglect, normal finger-nose exam bilaterally, no nystagmus noted  decreased sensation outer aspect left leg below knee, involves lateral aspect of foot  Skin: Skin is warm and dry. No rash noted.  Psychiatric: She has a normal mood and affect.    ED Course  Procedures (including critical care time) Labs  Review Labs Reviewed - No data to display Imaging Review Mr Lumbar Spine Wo Contrast  10/01/2013   CLINICAL DATA:  Low back pain with pain and numbness left lower extremity.  EXAM: MRI LUMBAR SPINE WITHOUT CONTRAST  TECHNIQUE: Multiplanar, multisequence MR imaging was performed. No intravenous contrast was administered.  COMPARISON:  CT ABD/PELVIS W CM dated 05/29/2011  FINDINGS: Five lumbar type vertebral bodies are assumed. The alignment is stable with a convex left scoliosis and a degenerative grade 1 anterolisthesis at L4-5. Advanced facet degenerative changes are present inferiorly. There is no evidence of acute fracture or pars defect.  The conus medullaris extends to the L1-2 level and appears normal. No paraspinal abnormalities are identified.There are small perineural cysts within the lower thoracic neural foramina bilaterally.  L1-2: There is loss of disc height with annular disc bulging and paraspinal osteophytes. There is mild resulting asymmetric narrowing of the right lateral recess and right foramen, but no definite nerve root encroachment.  L2-3:  Normal interspace.  L3-4: Disc height and hydration are maintained. There is mild facet and ligamentous hypertrophy. No spinal stenosis or nerve root encroachment.  L4-5: There is advanced facet hypertrophy, worse on the right. This accounts for the grade 1 anterolisthesis. There is only mild disc bulging and relatively preserved disc height. These factors contribute to mild central and lateral recess stenosis bilaterally. The foramina appear sufficiently patent.  L5-S1: There is chronic degenerative disc disease with loss of disc height, annular bulging and paraspinal osteophytes. There is moderate facet hypertrophy bilaterally. No significant spinal stenosis or nerve root encroachment.  IMPRESSION: 1. No specific explanation for left lower extremity radicular symptoms identified. 2. Degenerative disc disease at L1-2, contributing to mild right lateral  recess and right foraminal stenosis. 3. Advanced facet hypertrophy at L4-5 with resulting grade 1 anterolisthesis and mild disc bulging. There is mild resulting central and lateral recess stenosis bilaterally. 4. Chronic degenerative disc disease with facet hypertrophy at L5-S1. No significant spinal stenosis.   Electronically Signed   By: Camie Patience M.D.   On: 10/01/2013 13:31    EKG Interpretation    Date/Time:  Sunday October 01 2013 10:29:31 EST Ventricular Rate:  75 PR Interval:  148 QRS Duration: 101 QT Interval:  398 QTC Calculation: 444 R Axis:   12 Text Interpretation:  Sinus rhythm Abnormal R-wave progression, early transition No significant change since last tracing Confirmed by Torrian Canion  MD-J, Helmuth Recupero ([id number]) on 10/01/2013 10:43:23 AM            MDM   1. DDD (degenerative disc disease), lumbar    No acute nerve impingement demonstrated on mri.  Could be possibly related to a peripheral nerve compression.  Doubt stroke with her symptoms confined to below her knee and her normal neuro exam in the ED.  PT is having back pain.  She will follow up with her back doctor.  Rx for pain meds.    Kathalene Frames, MD 10/01/13 1400

## 2013-10-12 DIAGNOSIS — M48061 Spinal stenosis, lumbar region without neurogenic claudication: Secondary | ICD-10-CM | POA: Diagnosis not present

## 2013-10-13 DIAGNOSIS — M545 Low back pain, unspecified: Secondary | ICD-10-CM | POA: Diagnosis not present

## 2013-10-13 DIAGNOSIS — IMO0002 Reserved for concepts with insufficient information to code with codable children: Secondary | ICD-10-CM | POA: Diagnosis not present

## 2013-10-23 DIAGNOSIS — M259 Joint disorder, unspecified: Secondary | ICD-10-CM | POA: Diagnosis not present

## 2013-10-23 DIAGNOSIS — M545 Low back pain, unspecified: Secondary | ICD-10-CM | POA: Diagnosis not present

## 2013-10-23 DIAGNOSIS — IMO0002 Reserved for concepts with insufficient information to code with codable children: Secondary | ICD-10-CM | POA: Diagnosis not present

## 2013-10-30 DIAGNOSIS — M545 Low back pain, unspecified: Secondary | ICD-10-CM | POA: Diagnosis not present

## 2013-10-30 DIAGNOSIS — IMO0002 Reserved for concepts with insufficient information to code with codable children: Secondary | ICD-10-CM | POA: Diagnosis not present

## 2013-11-02 DIAGNOSIS — IMO0002 Reserved for concepts with insufficient information to code with codable children: Secondary | ICD-10-CM | POA: Diagnosis not present

## 2013-11-02 DIAGNOSIS — M545 Low back pain, unspecified: Secondary | ICD-10-CM | POA: Diagnosis not present

## 2013-11-06 DIAGNOSIS — M545 Low back pain, unspecified: Secondary | ICD-10-CM | POA: Diagnosis not present

## 2013-11-06 DIAGNOSIS — IMO0002 Reserved for concepts with insufficient information to code with codable children: Secondary | ICD-10-CM | POA: Diagnosis not present

## 2013-11-09 DIAGNOSIS — M545 Low back pain, unspecified: Secondary | ICD-10-CM | POA: Diagnosis not present

## 2013-11-09 DIAGNOSIS — IMO0002 Reserved for concepts with insufficient information to code with codable children: Secondary | ICD-10-CM | POA: Diagnosis not present

## 2013-11-13 DIAGNOSIS — IMO0002 Reserved for concepts with insufficient information to code with codable children: Secondary | ICD-10-CM | POA: Diagnosis not present

## 2013-11-13 DIAGNOSIS — M545 Low back pain, unspecified: Secondary | ICD-10-CM | POA: Diagnosis not present

## 2013-11-16 DIAGNOSIS — M545 Low back pain, unspecified: Secondary | ICD-10-CM | POA: Diagnosis not present

## 2013-11-16 DIAGNOSIS — IMO0002 Reserved for concepts with insufficient information to code with codable children: Secondary | ICD-10-CM | POA: Diagnosis not present

## 2013-11-20 DIAGNOSIS — M545 Low back pain, unspecified: Secondary | ICD-10-CM | POA: Diagnosis not present

## 2013-11-20 DIAGNOSIS — IMO0002 Reserved for concepts with insufficient information to code with codable children: Secondary | ICD-10-CM | POA: Diagnosis not present

## 2013-11-27 DIAGNOSIS — M545 Low back pain, unspecified: Secondary | ICD-10-CM | POA: Diagnosis not present

## 2013-11-27 DIAGNOSIS — IMO0002 Reserved for concepts with insufficient information to code with codable children: Secondary | ICD-10-CM | POA: Diagnosis not present

## 2013-11-30 DIAGNOSIS — IMO0002 Reserved for concepts with insufficient information to code with codable children: Secondary | ICD-10-CM | POA: Diagnosis not present

## 2013-11-30 DIAGNOSIS — M545 Low back pain, unspecified: Secondary | ICD-10-CM | POA: Diagnosis not present

## 2013-12-18 DIAGNOSIS — M545 Low back pain, unspecified: Secondary | ICD-10-CM | POA: Diagnosis not present

## 2013-12-18 DIAGNOSIS — IMO0002 Reserved for concepts with insufficient information to code with codable children: Secondary | ICD-10-CM | POA: Diagnosis not present

## 2013-12-25 DIAGNOSIS — Z6835 Body mass index (BMI) 35.0-35.9, adult: Secondary | ICD-10-CM | POA: Diagnosis not present

## 2013-12-25 DIAGNOSIS — E669 Obesity, unspecified: Secondary | ICD-10-CM | POA: Diagnosis not present

## 2013-12-25 DIAGNOSIS — K219 Gastro-esophageal reflux disease without esophagitis: Secondary | ICD-10-CM | POA: Diagnosis not present

## 2013-12-25 DIAGNOSIS — I10 Essential (primary) hypertension: Secondary | ICD-10-CM | POA: Diagnosis not present

## 2013-12-25 DIAGNOSIS — G500 Trigeminal neuralgia: Secondary | ICD-10-CM | POA: Diagnosis not present

## 2014-01-01 ENCOUNTER — Ambulatory Visit: Admitting: *Deleted

## 2014-03-19 DIAGNOSIS — R05 Cough: Secondary | ICD-10-CM | POA: Diagnosis not present

## 2014-03-19 DIAGNOSIS — R059 Cough, unspecified: Secondary | ICD-10-CM | POA: Diagnosis not present

## 2014-03-31 ENCOUNTER — Emergency Department (HOSPITAL_COMMUNITY)
Admission: EM | Admit: 2014-03-31 | Discharge: 2014-03-31 | Disposition: A | Discharge status: Home / Self Care | Payer: Medicare Other | Attending: Emergency Medicine | Admitting: Emergency Medicine

## 2014-03-31 ENCOUNTER — Emergency Department (HOSPITAL_COMMUNITY): Payer: Medicare Other

## 2014-03-31 ENCOUNTER — Encounter (HOSPITAL_COMMUNITY): Payer: Self-pay | Admitting: Emergency Medicine

## 2014-03-31 DIAGNOSIS — I10 Essential (primary) hypertension: Secondary | ICD-10-CM | POA: Insufficient documentation

## 2014-03-31 DIAGNOSIS — R079 Chest pain, unspecified: Secondary | ICD-10-CM | POA: Insufficient documentation

## 2014-03-31 DIAGNOSIS — R0602 Shortness of breath: Secondary | ICD-10-CM | POA: Diagnosis not present

## 2014-03-31 DIAGNOSIS — J069 Acute upper respiratory infection, unspecified: Secondary | ICD-10-CM | POA: Diagnosis not present

## 2014-03-31 DIAGNOSIS — R05 Cough: Secondary | ICD-10-CM | POA: Diagnosis not present

## 2014-03-31 DIAGNOSIS — J45901 Unspecified asthma with (acute) exacerbation: Secondary | ICD-10-CM | POA: Diagnosis not present

## 2014-03-31 DIAGNOSIS — IMO0002 Reserved for concepts with insufficient information to code with codable children: Secondary | ICD-10-CM | POA: Diagnosis not present

## 2014-03-31 DIAGNOSIS — Z8669 Personal history of other diseases of the nervous system and sense organs: Secondary | ICD-10-CM | POA: Insufficient documentation

## 2014-03-31 DIAGNOSIS — R000 Tachycardia, unspecified: Secondary | ICD-10-CM | POA: Insufficient documentation

## 2014-03-31 DIAGNOSIS — Z79899 Other long term (current) drug therapy: Secondary | ICD-10-CM | POA: Insufficient documentation

## 2014-03-31 DIAGNOSIS — R059 Cough, unspecified: Secondary | ICD-10-CM | POA: Diagnosis not present

## 2014-03-31 LAB — COMPREHENSIVE METABOLIC PANEL
ALBUMIN: 3.7 g/dL (ref 3.5–5.2)
ALK PHOS: 89 U/L (ref 39–117)
ALT: 18 U/L (ref 0–35)
ANION GAP: 16 — AB (ref 5–15)
AST: 15 U/L (ref 0–37)
BUN: 17 mg/dL (ref 6–23)
CO2: 22 mEq/L (ref 19–32)
CREATININE: 1.02 mg/dL (ref 0.50–1.10)
Calcium: 9.5 mg/dL (ref 8.4–10.5)
Chloride: 100 mEq/L (ref 96–112)
GFR calc Af Amer: 65 mL/min — ABNORMAL LOW (ref >90)
GFR calc non Af Amer: 56 mL/min — ABNORMAL LOW (ref >90)
Glucose, Bld: 147 mg/dL — ABNORMAL HIGH (ref 70–99)
POTASSIUM: 4.7 meq/L (ref 3.7–5.3)
Sodium: 138 mEq/L (ref 137–147)
TOTAL PROTEIN: 7.1 g/dL (ref 6.0–8.3)
Total Bilirubin: 0.3 mg/dL (ref 0.3–1.2)

## 2014-03-31 LAB — CBC WITH DIFFERENTIAL/PLATELET
BASOS ABS: 0 10*3/uL (ref 0.0–0.1)
BASOS PCT: 0 % (ref 0–1)
EOS ABS: 0 10*3/uL (ref 0.0–0.7)
Eosinophils Relative: 0 % (ref 0–5)
HCT: 38.1 % (ref 36.0–46.0)
HEMOGLOBIN: 12.6 g/dL (ref 12.0–15.0)
Lymphocytes Relative: 11 % — ABNORMAL LOW (ref 12–46)
Lymphs Abs: 1.3 10*3/uL (ref 0.7–4.0)
MCH: 27.6 pg (ref 26.0–34.0)
MCHC: 33.1 g/dL (ref 30.0–36.0)
MCV: 83.6 fL (ref 78.0–100.0)
MONOS PCT: 3 % (ref 3–12)
Monocytes Absolute: 0.3 10*3/uL (ref 0.1–1.0)
NEUTROS PCT: 86 % — AB (ref 43–77)
Neutro Abs: 10.6 10*3/uL — ABNORMAL HIGH (ref 1.7–7.7)
Platelets: 328 10*3/uL (ref 150–400)
RBC: 4.56 MIL/uL (ref 3.87–5.11)
RDW: 14.7 % (ref 11.5–15.5)
WBC: 12.3 10*3/uL — ABNORMAL HIGH (ref 4.0–10.5)

## 2014-03-31 LAB — I-STAT TROPONIN, ED: TROPONIN I, POC: 0.01 ng/mL (ref 0.00–0.08)

## 2014-03-31 LAB — D-DIMER, QUANTITATIVE (NOT AT ARMC)

## 2014-03-31 MED ORDER — ALBUTEROL SULFATE HFA 108 (90 BASE) MCG/ACT IN AERS
2.0000 | INHALATION_SPRAY | RESPIRATORY_TRACT | Status: DC
  Administered 2014-03-31: 2 via RESPIRATORY_TRACT
  Filled 2014-03-31: qty 6.7

## 2014-03-31 MED ORDER — ALBUTEROL SULFATE (2.5 MG/3ML) 0.083% IN NEBU
5.0000 mg | INHALATION_SOLUTION | Freq: Once | RESPIRATORY_TRACT | Status: AC
  Administered 2014-03-31: 5 mg via RESPIRATORY_TRACT
  Filled 2014-03-31: qty 6

## 2014-03-31 NOTE — ED Notes (Signed)
Patient has a deep cough nonproductive.  Patient flew to [place] 02/27/2014 and returned on the 24th and developed a cough on the 25th.  Denies swelling of the extremities.

## 2014-03-31 NOTE — ED Notes (Signed)
Discharge instructions reviewed  Voiced understanding 

## 2014-03-31 NOTE — ED Notes (Signed)
The pt just returned from [place] 2 weeks ago.  Since Saturday she has had a non-productive cough.  She has been seen by her doctor  And sat she was seen at [facility] walk-in.  She was tolkd there if she developed sob tio cme to the ed.  She reports that if she develops sob to come here.  No sob at present.  She drove 14 hours from [place] when she returned from [place].  She does not have any chest pain no leg pain.  No chest xrtay  From either her doctor and or the [facility] walk-in

## 2014-03-31 NOTE — ED Notes (Signed)
No leg pain , no calf pain noted

## 2014-03-31 NOTE — ED Notes (Signed)
Breathing treatment completed.

## 2014-03-31 NOTE — ED Notes (Signed)
Old IV site started bleeding. Pressure dressing applied

## 2014-03-31 NOTE — ED Notes (Signed)
Patient returned from X-ray 

## 2014-03-31 NOTE — Discharge Instructions (Signed)

## 2014-03-31 NOTE — ED Notes (Signed)
MD at the bedside  

## 2014-03-31 NOTE — ED Provider Notes (Signed)
CSN: [id number]     Arrival date & time 03/31/14  1833 History   First MD Initiated Contact with Patient 03/31/14 1847     Chief Complaint  Patient presents with  . Shortness of Breath     (Consider location/radiation/quality/duration/timing/severity/associated sxs/prior Treatment) HPI  Shelley Barrera is a 68 y.o. female patient with a past medical history of hypertension trigeminal neuralgia, presenting for shortness of breath, and cough. Patient states that over the last few weeks she's had multiple trips including an airplane trip overseas, as well as a long road trip of approximately 14 hours. Partly 2 weeks ago patient began having a cough, the day after completing her road travel. Cough persisted for the past 2 weeks, has been nonproductive, no associated fever, chills, abdominal pain, diaphoresis. Patient does endorse chest pain associated with a cough, as well shortness of breath. Patient went to an urgent care facility earlier today due to concerns for persistent cough, was advised to come to the ED for evaluation for possible pulmonary embolism. Patient has a history of asthma but does not use medications rarely. Patient states the chest pain sharp and substernal.  Shortness of breath is mild/moderate. She denies any unilateral leg swelling.    Past Medical History  Diagnosis Date  . Hypertension   . Trigeminal neuralgia    History reviewed. No pertinent past surgical history. No family history on file. History  Substance Use Topics  . Smoking status: Never Smoker   . Smokeless tobacco: Not on file  . Alcohol Use: No   OB History   Grav Para Term Preterm Abortions TAB SAB Ect Mult Living                 Review of Systems  Constitutional: Negative.   HENT: Negative.   Eyes: Negative.   Respiratory: Positive for cough and shortness of breath.   Cardiovascular: Positive for chest pain.  Gastrointestinal: Negative.   Endocrine: Negative.   Genitourinary: Negative.    Musculoskeletal: Negative.   Skin: Negative.   Allergic/Immunologic: Negative.   Neurological: Negative.   Hematological: Negative.   Psychiatric/Behavioral: Negative.       Allergies  Demerol and Tegretol  Home Medications   Prior to Admission medications   Medication Sig Start Date End Date Taking? Authorizing Provider  cetirizine (ZYRTEC) 10 MG tablet Take 10 mg by mouth at bedtime.    Yes Historical Provider, MD  guaiFENesin-dextromethorphan (ROBITUSSIN DM) 100-10 MG/5ML syrup Take 15 mLs by mouth every 8 (eight) hours as needed for cough.   Yes Historical Provider, MD  HYDROcodone-acetaminophen (NORCO/VICODIN) 5-325 MG per tablet Take 1-2 tablets by mouth every 4 (four) hours as needed. 10/01/13  Yes Md Dorie Rank, MD  lansoprazole (PREVACID) 30 MG capsule Take 30 mg by mouth every evening.    Yes Historical Provider, MD  mometasone (NASONEX) 50 MCG/ACT nasal spray Place 2 sprays into the nose daily.   Yes Historical Provider, MD  Oxcarbazepine (TRILEPTAL) 300 MG tablet Take 300 mg by mouth 2 (two) times daily.     Yes Historical Provider, MD  phentermine 30 MG capsule Take 30 mg by mouth every morning.   Yes Historical Provider, MD  Phenylephrine-DM-GG-APAP (TYLENOL COLD MULTI-SYMPTOM) 5-10-200-325 MG TABS Take 2 tablets by mouth daily as needed (for cold).   Yes Historical Provider, MD  Polyethyl Glycol-Propyl Glycol (SYSTANE) 0.4-0.3 % GEL Place 1 application into the left eye at bedtime.   Yes Historical Provider, MD  predniSONE (STERAPRED UNI-PAK) 5 MG  TABS tablet Take 5-30 mg by mouth daily.   Yes Historical Provider, MD  valsartan (DIOVAN) 80 MG tablet Take 80 mg by mouth every other day.    Yes Historical Provider, MD  Wheat Dextrin (BENEFIBER PO) Take 2 tablets by mouth 2 (two) times daily.   Yes Historical Provider, MD   BP 157/84  Pulse 104  Temp(Src) 99 F (37.2 C) (Oral)  Resp 18  SpO2 98% Physical Exam  Nursing note and vitals reviewed. Constitutional: She is  oriented to person, place, and time. She appears well-developed and well-nourished. No distress.  HENT:  Head: Normocephalic and atraumatic.  Right Ear: External ear normal.  Left Ear: External ear normal.  Nose: Nose normal.  Mouth/Throat: Oropharynx is clear and moist. No oropharyngeal exudate.  Eyes: Conjunctivae and EOM are normal. Pupils are equal, round, and reactive to light. Right eye exhibits no discharge. Left eye exhibits no discharge. No scleral icterus.  Neck: Normal range of motion. Neck supple. No JVD present. No tracheal deviation present. No thyromegaly present.  Cardiovascular: Regular rhythm, normal heart sounds and intact distal pulses.  Tachycardia present.  Exam reveals no gallop and no friction rub.   No murmur heard. Pulmonary/Chest: Effort normal. No stridor. No respiratory distress. She has decreased breath sounds. She has no wheezes. She has rhonchi. She has no rales. She exhibits no tenderness.  Abdominal: Soft. She exhibits no distension.  Musculoskeletal: Normal range of motion. She exhibits no edema and no tenderness.  Lymphadenopathy:    She has no cervical adenopathy.  Neurological: She is alert and oriented to person, place, and time. No cranial nerve deficit. She exhibits normal muscle tone.  Skin: Skin is warm and dry. No rash noted. She is not diaphoretic. No erythema. No pallor.  Psychiatric: She has a normal mood and affect. Her behavior is normal. Judgment and thought content normal.    ED Course  Procedures (including critical care time) Labs Review Labs Reviewed  CBC WITH DIFFERENTIAL - Abnormal; Notable for the following:    WBC 12.3 (*)    Neutrophils Relative % 86 (*)    Neutro Abs 10.6 (*)    Lymphocytes Relative 11 (*)    All other components within normal limits  COMPREHENSIVE METABOLIC PANEL - Abnormal; Notable for the following:    Glucose, Bld 147 (*)    GFR calc non Af Amer 56 (*)    GFR calc Af Amer 65 (*)    Anion gap 16 (*)     All other components within normal limits  D-DIMER, QUANTITATIVE  I-STAT TROPOININ, ED    Imaging Review Dg Chest 2 View  03/31/2014   CLINICAL DATA:  Shortness of breath for 2 days. Nonproductive cough. History of asthma. Hypertension.  EXAM: CHEST  2 VIEW  COMPARISON:  06/06/2009.  FINDINGS: Moderate scoliosis of the thoracic spine convex RIGHT. Mild cardiomegaly is stable. Normal mediastinal configuration. No infiltrates or failure. No effusion or pneumothorax.  IMPRESSION: No active cardiopulmonary disease.  Stable exam from 2010.   Electronically Signed   By: Rolla Flatten M.D.   On: 03/31/2014 20:30     EKG Interpretation   Date/Time:  Saturday March 31 2014 18:40:20 EDT Ventricular Rate:  96 PR Interval:  132 QRS Duration: 84 QT Interval:  336 QTC Calculation: 424 R Axis:   4 Text Interpretation:  Normal sinus rhythm with sinus arrhythmia RSR' or QR  pattern in V1 suggests right ventricular conduction delay Borderline ECG  No significant change  since last tracing Confirmed by [facility]  MD, DAVID  ([id number]) on 03/31/2014 6:51:17 PM      MDM   Final diagnoses:  Reactive airway disease, unspecified asthma severity, with acute exacerbation   Patient's presentation, and recent history, concerning for PE. However, no preceding unilateral leg swelling makes this less likely. Aortic dissection less likely due to patient having associated back pain. Additional considerations include bronchitis, pneumonia, asthma exacerbation.  We'll obtain basic labs including a d-dimer to evaluate patient's risk of a PE, or other explanations of her chest pain shortness of breath.  On lung exam patient does have reduced breath sounds, coarse but nonfocal. Note improvement in aeration following albuterol. Minimal expiratory wheeze.  Laboratory workup negative including d-dimer, chest x-ray does not show any focal airspace disease. Bronchitis or reactive airway disease most likely explanation. Patient does  endorse that she has a history of reactive airway disease, which would explain her dry cough which has been persistent.  Patient given return precautions for chest pain.  She was started on steroids today, which she can continue.  Given albuterol for symptomatic Tx.  Advised to return for worsening symptoms including chest pain, shortness of breath, severe headache, intractable nausea or vomiting, fever, or chills, inability to take medications, or other acute concerns.  Advised to follow up with PCP in 2 days.  Patient in agreement with and expressed understanding of follow plan, plan of care, and return precautions.  All questions answered prior to discharge.  Patient was discharged in stable condition, ambulating without difficulty.   Patient care was discussed with my attending, Dr. Darl Householder.  Hoyle Sauer, MD 04/01/14 [phone]

## 2014-04-02 NOTE — ED Provider Notes (Signed)
I saw and evaluated the patient, reviewed the resident's note and I agree with the findings and plan.   EKG Interpretation   Date/Time:  Saturday March 31 2014 18:40:20 EDT Ventricular Rate:  96 PR Interval:  132 QRS Duration: 84 QT Interval:  336 QTC Calculation: 424 R Axis:   4 Text Interpretation:  Normal sinus rhythm with sinus arrhythmia RSR' or QR  pattern in V1 suggests right ventricular conduction delay Borderline ECG  No significant change since last tracing Confirmed by Serra Younan  MD, Barak Bialecki  ([id number]) on 03/31/2014 6:51:17 PM      Shelley Barrera is a 68 y.o. female here with cough. She recently traveled to [place] and drove back from [place]. Has been having cough for 2 weeks. No fever or chills. Has seasonal allergies. Denies hx of PE. Vitals significant for mild tachycardia, not hypoxic. Minimal wheezing on lungs, cardiac exam unremarkable. Likely bronchitis, but given recent travel, consider PE. D-dimer neg. Labs unremarkable. CXR showed no pneumonia. Given albuterol. Will give allegra for seasonal allergies as well. She was prescribed steroids by urgent care and I encourage her to take it as well.   Wandra Arthurs, MD 04/02/14 [phone]

## 2014-04-10 DIAGNOSIS — J45909 Unspecified asthma, uncomplicated: Secondary | ICD-10-CM | POA: Diagnosis not present

## 2014-04-10 DIAGNOSIS — J309 Allergic rhinitis, unspecified: Secondary | ICD-10-CM | POA: Diagnosis not present

## 2014-05-24 DIAGNOSIS — J3089 Other allergic rhinitis: Secondary | ICD-10-CM | POA: Diagnosis not present

## 2014-05-24 DIAGNOSIS — J3081 Allergic rhinitis due to animal (cat) (dog) hair and dander: Secondary | ICD-10-CM | POA: Diagnosis not present

## 2014-05-24 DIAGNOSIS — H1045 Other chronic allergic conjunctivitis: Secondary | ICD-10-CM | POA: Diagnosis not present

## 2014-05-24 DIAGNOSIS — J4520 Mild intermittent asthma, uncomplicated: Secondary | ICD-10-CM | POA: Diagnosis not present

## 2014-06-13 DIAGNOSIS — M5416 Radiculopathy, lumbar region: Secondary | ICD-10-CM | POA: Diagnosis not present

## 2014-06-13 DIAGNOSIS — M4806 Spinal stenosis, lumbar region: Secondary | ICD-10-CM | POA: Diagnosis not present

## 2014-06-13 DIAGNOSIS — Z23 Encounter for immunization: Secondary | ICD-10-CM | POA: Diagnosis not present

## 2014-06-21 ENCOUNTER — Other Ambulatory Visit: Payer: Self-pay

## 2014-06-21 DIAGNOSIS — Z1231 Encounter for screening mammogram for malignant neoplasm of breast: Secondary | ICD-10-CM

## 2014-06-28 DIAGNOSIS — F324 Major depressive disorder, single episode, in partial remission: Secondary | ICD-10-CM | POA: Diagnosis not present

## 2014-06-28 DIAGNOSIS — R7309 Other abnormal glucose: Secondary | ICD-10-CM | POA: Diagnosis not present

## 2014-06-28 DIAGNOSIS — Z23 Encounter for immunization: Secondary | ICD-10-CM | POA: Diagnosis not present

## 2014-06-28 DIAGNOSIS — M519 Unspecified thoracic, thoracolumbar and lumbosacral intervertebral disc disorder: Secondary | ICD-10-CM | POA: Diagnosis not present

## 2014-06-28 DIAGNOSIS — K219 Gastro-esophageal reflux disease without esophagitis: Secondary | ICD-10-CM | POA: Diagnosis not present

## 2014-06-28 DIAGNOSIS — G500 Trigeminal neuralgia: Secondary | ICD-10-CM | POA: Diagnosis not present

## 2014-06-28 DIAGNOSIS — I10 Essential (primary) hypertension: Secondary | ICD-10-CM | POA: Diagnosis not present

## 2014-06-28 DIAGNOSIS — Z0000 Encounter for general adult medical examination without abnormal findings: Secondary | ICD-10-CM | POA: Diagnosis not present

## 2014-06-28 DIAGNOSIS — E782 Mixed hyperlipidemia: Secondary | ICD-10-CM | POA: Diagnosis not present

## 2014-06-28 DIAGNOSIS — J309 Allergic rhinitis, unspecified: Secondary | ICD-10-CM | POA: Diagnosis not present

## 2014-07-03 ENCOUNTER — Ambulatory Visit
Admission: RE | Admit: 2014-07-03 | Discharge: 2014-07-03 | Disposition: A | Discharge status: Home / Self Care | Source: Ambulatory Visit

## 2014-07-03 DIAGNOSIS — Z1231 Encounter for screening mammogram for malignant neoplasm of breast: Secondary | ICD-10-CM | POA: Diagnosis not present

## 2014-07-25 DIAGNOSIS — M8580 Other specified disorders of bone density and structure, unspecified site: Secondary | ICD-10-CM | POA: Diagnosis not present

## 2014-07-25 DIAGNOSIS — M899 Disorder of bone, unspecified: Secondary | ICD-10-CM | POA: Diagnosis not present

## 2014-08-28 DIAGNOSIS — H1045 Other chronic allergic conjunctivitis: Secondary | ICD-10-CM | POA: Diagnosis not present

## 2014-08-28 DIAGNOSIS — J3081 Allergic rhinitis due to animal (cat) (dog) hair and dander: Secondary | ICD-10-CM | POA: Diagnosis not present

## 2014-08-28 DIAGNOSIS — J4520 Mild intermittent asthma, uncomplicated: Secondary | ICD-10-CM | POA: Diagnosis not present

## 2014-08-28 DIAGNOSIS — J3089 Other allergic rhinitis: Secondary | ICD-10-CM | POA: Diagnosis not present

## 2014-09-12 DIAGNOSIS — K219 Gastro-esophageal reflux disease without esophagitis: Secondary | ICD-10-CM | POA: Diagnosis not present

## 2014-09-12 DIAGNOSIS — F325 Major depressive disorder, single episode, in full remission: Secondary | ICD-10-CM | POA: Diagnosis not present

## 2014-09-12 DIAGNOSIS — I10 Essential (primary) hypertension: Secondary | ICD-10-CM | POA: Diagnosis not present

## 2014-10-03 DIAGNOSIS — M7541 Impingement syndrome of right shoulder: Secondary | ICD-10-CM | POA: Diagnosis not present

## 2014-10-03 DIAGNOSIS — M5412 Radiculopathy, cervical region: Secondary | ICD-10-CM | POA: Diagnosis not present

## 2014-10-03 DIAGNOSIS — M542 Cervicalgia: Secondary | ICD-10-CM | POA: Diagnosis not present

## 2014-10-16 DIAGNOSIS — M25511 Pain in right shoulder: Secondary | ICD-10-CM | POA: Diagnosis not present

## 2014-10-16 DIAGNOSIS — M7541 Impingement syndrome of right shoulder: Secondary | ICD-10-CM | POA: Diagnosis not present

## 2014-11-08 DIAGNOSIS — M7541 Impingement syndrome of right shoulder: Secondary | ICD-10-CM | POA: Diagnosis not present

## 2014-11-08 DIAGNOSIS — M542 Cervicalgia: Secondary | ICD-10-CM | POA: Diagnosis not present

## 2014-12-11 DIAGNOSIS — M542 Cervicalgia: Secondary | ICD-10-CM | POA: Diagnosis not present

## 2014-12-11 DIAGNOSIS — M7541 Impingement syndrome of right shoulder: Secondary | ICD-10-CM | POA: Diagnosis not present

## 2015-01-25 DIAGNOSIS — L700 Acne vulgaris: Secondary | ICD-10-CM | POA: Diagnosis not present

## 2015-02-01 DIAGNOSIS — M722 Plantar fascial fibromatosis: Secondary | ICD-10-CM | POA: Diagnosis not present

## 2015-02-01 DIAGNOSIS — M79671 Pain in right foot: Secondary | ICD-10-CM | POA: Diagnosis not present

## 2015-02-18 ENCOUNTER — Other Ambulatory Visit: Payer: Self-pay

## 2015-03-05 DIAGNOSIS — J4520 Mild intermittent asthma, uncomplicated: Secondary | ICD-10-CM | POA: Diagnosis not present

## 2015-03-05 DIAGNOSIS — J3081 Allergic rhinitis due to animal (cat) (dog) hair and dander: Secondary | ICD-10-CM | POA: Diagnosis not present

## 2015-03-05 DIAGNOSIS — H1045 Other chronic allergic conjunctivitis: Secondary | ICD-10-CM | POA: Diagnosis not present

## 2015-03-05 DIAGNOSIS — J3089 Other allergic rhinitis: Secondary | ICD-10-CM | POA: Diagnosis not present

## 2015-03-14 DIAGNOSIS — B078 Other viral warts: Secondary | ICD-10-CM | POA: Diagnosis not present

## 2015-03-14 DIAGNOSIS — Z85828 Personal history of other malignant neoplasm of skin: Secondary | ICD-10-CM | POA: Diagnosis not present

## 2015-03-14 DIAGNOSIS — L814 Other melanin hyperpigmentation: Secondary | ICD-10-CM | POA: Diagnosis not present

## 2015-03-14 DIAGNOSIS — L820 Inflamed seborrheic keratosis: Secondary | ICD-10-CM | POA: Diagnosis not present

## 2015-05-21 ENCOUNTER — Emergency Department (HOSPITAL_COMMUNITY)
Admission: EM | Admit: 2015-05-21 | Discharge: 2015-05-21 | Disposition: A | Discharge status: Home / Self Care | Payer: Medicare Other | Attending: Emergency Medicine | Admitting: Emergency Medicine

## 2015-05-21 ENCOUNTER — Emergency Department (HOSPITAL_COMMUNITY): Payer: Medicare Other

## 2015-05-21 DIAGNOSIS — Y9289 Other specified places as the place of occurrence of the external cause: Secondary | ICD-10-CM | POA: Insufficient documentation

## 2015-05-21 DIAGNOSIS — S63259A Unspecified dislocation of unspecified finger, initial encounter: Secondary | ICD-10-CM

## 2015-05-21 DIAGNOSIS — S6992XA Unspecified injury of left wrist, hand and finger(s), initial encounter: Secondary | ICD-10-CM | POA: Diagnosis present

## 2015-05-21 DIAGNOSIS — Z7951 Long term (current) use of inhaled steroids: Secondary | ICD-10-CM | POA: Diagnosis not present

## 2015-05-21 DIAGNOSIS — Z79899 Other long term (current) drug therapy: Secondary | ICD-10-CM | POA: Diagnosis not present

## 2015-05-21 DIAGNOSIS — S63295A Dislocation of distal interphalangeal joint of left ring finger, initial encounter: Secondary | ICD-10-CM | POA: Diagnosis not present

## 2015-05-21 DIAGNOSIS — Z8669 Personal history of other diseases of the nervous system and sense organs: Secondary | ICD-10-CM | POA: Insufficient documentation

## 2015-05-21 DIAGNOSIS — Y998 Other external cause status: Secondary | ICD-10-CM | POA: Diagnosis not present

## 2015-05-21 DIAGNOSIS — S63293A Dislocation of distal interphalangeal joint of left middle finger, initial encounter: Secondary | ICD-10-CM | POA: Diagnosis not present

## 2015-05-21 DIAGNOSIS — Y9301 Activity, walking, marching and hiking: Secondary | ICD-10-CM | POA: Insufficient documentation

## 2015-05-21 DIAGNOSIS — W231XXA Caught, crushed, jammed, or pinched between stationary objects, initial encounter: Secondary | ICD-10-CM | POA: Insufficient documentation

## 2015-05-21 DIAGNOSIS — I10 Essential (primary) hypertension: Secondary | ICD-10-CM | POA: Diagnosis not present

## 2015-05-21 DIAGNOSIS — S63255A Unspecified dislocation of left ring finger, initial encounter: Secondary | ICD-10-CM | POA: Diagnosis not present

## 2015-05-21 MED ORDER — HYDROCODONE-ACETAMINOPHEN 5-325 MG PO TABS
1.0000 | ORAL_TABLET | Freq: Once | ORAL | Status: AC
  Administered 2015-05-21: 1 via ORAL
  Filled 2015-05-21: qty 1

## 2015-05-21 MED ORDER — BUPIVACAINE HCL 0.5 % IJ SOLN
50.0000 mL | Freq: Once | INTRAMUSCULAR | Status: DC
  Filled 2015-05-21: qty 50

## 2015-05-21 MED ORDER — HYDROCODONE-ACETAMINOPHEN 5-325 MG PO TABS: 1.0000 | ORAL_TABLET | ORAL | Status: AC | PRN

## 2015-05-21 MED ORDER — IBUPROFEN 800 MG PO TABS: 800.0000 mg | ORAL_TABLET | Freq: Three times a day (TID) | ORAL | Status: AC

## 2015-05-21 MED ORDER — BUPIVACAINE HCL (PF) 0.5 % IJ SOLN
INTRAMUSCULAR | Status: AC
  Filled 2015-05-21: qty 30

## 2015-05-21 NOTE — Discharge Instructions (Signed)
Finger Dislocation Finger dislocation is the displacement of bones in your finger at the joints. Most commonly, finger dislocation occurs at the proximal interphalangeal joint (the joint closest to your knuckle). Very strong, fibrous tissues (ligaments) and joint capsules connect the three bones of your fingers.  CAUSES Dislocation is caused by a forceful impact. This impact moves these bones off the joint and often tears your ligaments.  SYMPTOMS Symptoms of finger dislocation include:  Deformity of your finger.  Pain, with loss of movement. DIAGNOSIS  Finger dislocation is diagnosed with a physical exam. Often, X-ray exams are done to see if you have associated injuries, such as bone fractures. TREATMENT  Finger dislocations are treated by putting your bones back into position (reduction) either by manually moving the bones back into place or through surgery. Your finger is then kept in a fixed position (immobilized) with the use of a dressing or splint for a brief period. When your ligament has to be surgically repaired, it needs to be kept in a fixed position with a dressing or splint for 1 to 2 weeks. Because joint stiffness is a long-term complication of finger dislocation, hand exercises or physical therapy to increase the range of motion and to regain strength is usually started as soon as the ligament is healed. Exercises and therapy generally last no more than 3 months. HOME CARE INSTRUCTIONS The following measures can help to reduce pain and speed up the healing process:  Rest your injured joint. Do not move until instructed otherwise by your caregiver. Avoid activities similar to the one that caused your injury.  Apply ice to your injured joint for the first day or 2 after your reduction or as directed by your caregiver. Applying ice helps to reduce inflammation and pain.  Put ice in a plastic bag.  Place a towel between your skin and the bag.  Leave the ice on for 15-20 minutes  at a time, every 2 hours while you are awake.  Elevate your hand above your heart as directed by your caregiver to reduce swelling.  Take over-the-counter or prescription medicine for pain as your caregiver instructs you. SEEK IMMEDIATE MEDICAL CARE IF:  Your dressing or splint becomes damaged.  Your pain becomes worse rather than better.  You lose feeling in your finger, or it becomes cold and white. MAKE SURE YOU:  Understand these instructions.  Will watch your condition.  Will get help right away if you are not doing well or get worse. Document Released: 08/07/2000 Document Revised: 11/02/2011 Document Reviewed: 05/31/2011 [facility] Patient Information 2015 [place], [place]. This information is not intended to replace advice given to you by your health care provider. Make sure you discuss any questions you have with your health care provider. Cryotherapy Cryotherapy means treatment with cold. Ice or gel packs can be used to reduce both pain and swelling. Ice is the most helpful within the first 24 to 48 hours after an injury or flare-up from overusing a muscle or joint. Sprains, strains, spasms, burning pain, shooting pain, and aches can all be eased with ice. Ice can also be used when recovering from surgery. Ice is effective, has very few side effects, and is safe for most people to use. PRECAUTIONS  Ice is not a safe treatment option for people with:  Raynaud phenomenon. This is a condition affecting small blood vessels in the extremities. Exposure to cold may cause your problems to return.  Cold hypersensitivity. There are many forms of cold hypersensitivity, including:  Cold urticaria. Red, itchy hives appear on the skin when the tissues begin to warm after being iced.  Cold erythema. This is a red, itchy rash caused by exposure to cold.  Cold hemoglobinuria. Red blood cells break down when the tissues begin to warm after being iced. The hemoglobin that carry oxygen are  passed into the urine because they cannot combine with blood proteins fast enough.  Numbness or altered sensitivity in the area being iced. If you have any of the following conditions, do not use ice until you have discussed cryotherapy with your caregiver:  Heart conditions, such as arrhythmia, angina, or chronic heart disease.  High blood pressure.  Healing wounds or open skin in the area being iced.  Current infections.  Rheumatoid arthritis.  Poor circulation.  Diabetes. Ice slows the blood flow in the region it is applied. This is beneficial when trying to stop inflamed tissues from spreading irritating chemicals to surrounding tissues. However, if you expose your skin to cold temperatures for too long or without the proper protection, you can damage your skin or nerves. Watch for signs of skin damage due to cold. HOME CARE INSTRUCTIONS Follow these tips to use ice and cold packs safely.  Place a dry or damp towel between the ice and skin. A damp towel will cool the skin more quickly, so you may need to shorten the time that the ice is used.  For a more rapid response, add gentle compression to the ice.  Ice for no more than 10 to 20 minutes at a time. The bonier the area you are icing, the less time it will take to get the benefits of ice.  Check your skin after 5 minutes to make sure there are no signs of a poor response to cold or skin damage.  Rest 20 minutes or more between uses.  Once your skin is numb, you can end your treatment. You can test numbness by very lightly touching your skin. The touch should be so light that you do not see the skin dimple from the pressure of your fingertip. When using ice, most people will feel these normal sensations in this order: cold, burning, aching, and numbness.  Do not use ice on someone who cannot communicate their responses to pain, such as small children or people with dementia. HOW TO MAKE AN ICE PACK Ice packs are the most  common way to use ice therapy. Other methods include ice massage, ice baths, and cryosprays. Muscle creams that cause a cold, tingly feeling do not offer the same benefits that ice offers and should not be used as a substitute unless recommended by your caregiver. To make an ice pack, do one of the following:  Place crushed ice or a bag of frozen vegetables in a sealable plastic bag. Squeeze out the excess air. Place this bag inside another plastic bag. Slide the bag into a pillowcase or place a damp towel between your skin and the bag.  Mix 3 parts water with 1 part rubbing alcohol. Freeze the mixture in a sealable plastic bag. When you remove the mixture from the freezer, it will be slushy. Squeeze out the excess air. Place this bag inside another plastic bag. Slide the bag into a pillowcase or place a damp towel between your skin and the bag. SEEK MEDICAL CARE IF:  You develop white spots on your skin. This may give the skin a blotchy (mottled) appearance.  Your skin turns blue or pale.  Your skin  becomes waxy or hard.  Your swelling gets worse. MAKE SURE YOU:   Understand these instructions.  Will watch your condition.  Will get help right away if you are not doing well or get worse. Document Released: 04/06/2011 Document Revised: 12/25/2013 Document Reviewed: 04/06/2011 [facility] Patient Information 2015 [place], [place]. This information is not intended to replace advice given to you by your health care provider. Make sure you discuss any questions you have with your health care provider.

## 2015-05-21 NOTE — ED Provider Notes (Signed)
CSN: [id number]     Arrival date & time 05/21/15  1939 History  By signing my name below, I, Emmanuella Mensah, attest that this documentation has been prepared under the direction and in the presence of Charlann Lange, PA-C. Electronically Signed: Judithann Sauger, ED Scribe. 05/21/2015. 9:22 PM.     Chief Complaint  Patient presents with  . Finger Injury   The history is provided by the patient. No language interpreter was used.   HPI Comments: Shelley Barrera is a 69 y.o. female who presents to the Emergency Department complaining of gradually worsening left ring finger deformity. She reports that her hand was caught in the dog's lease as she was walking her dog causing a deformity in her finger. She reports difficulty extending that finger with significant pain. No alleviating factors noted.   Past Medical History  Diagnosis Date  . Hypertension   . Trigeminal neuralgia    No past surgical history on file. No family history on file. Social History  Substance Use Topics  . Smoking status: Never Smoker   . Smokeless tobacco: Not on file  . Alcohol Use: No   OB History    No data available     Review of Systems  Constitutional: Negative for fever.  Musculoskeletal: Positive for arthralgias.      Allergies  Demerol and Tegretol  Home Medications   Prior to Admission medications   Medication Sig Start Date End Date Taking? Authorizing Provider  cetirizine (ZYRTEC) 10 MG tablet Take 10 mg by mouth at bedtime.     Historical Provider, MD  guaiFENesin-dextromethorphan (ROBITUSSIN DM) 100-10 MG/5ML syrup Take 15 mLs by mouth every 8 (eight) hours as needed for cough.    Historical Provider, MD  HYDROcodone-acetaminophen (NORCO/VICODIN) 5-325 MG per tablet Take 1-2 tablets by mouth every 4 (four) hours as needed. 10/01/13   Dorie Rank, MD  lansoprazole (PREVACID) 30 MG capsule Take 30 mg by mouth every evening.     Historical Provider, MD  mometasone (NASONEX) 50 MCG/ACT nasal  spray Place 2 sprays into the nose daily.    Historical Provider, MD  Oxcarbazepine (TRILEPTAL) 300 MG tablet Take 300 mg by mouth 2 (two) times daily.      Historical Provider, MD  phentermine 30 MG capsule Take 30 mg by mouth every morning.    Historical Provider, MD  Phenylephrine-DM-GG-APAP (TYLENOL COLD MULTI-SYMPTOM) 5-10-200-325 MG TABS Take 2 tablets by mouth daily as needed (for cold).    Historical Provider, MD  Polyethyl Glycol-Propyl Glycol (SYSTANE) 0.4-0.3 % GEL Place 1 application into the left eye at bedtime.    Historical Provider, MD  predniSONE (STERAPRED UNI-PAK) 5 MG TABS tablet Take 5-30 mg by mouth daily.    Historical Provider, MD  valsartan (DIOVAN) 80 MG tablet Take 80 mg by mouth every other day.     Historical Provider, MD  Wheat Dextrin (BENEFIBER PO) Take 2 tablets by mouth 2 (two) times daily.    Historical Provider, MD   BP 157/82 mmHg  Pulse 89  Temp(Src) 98.3 F (36.8 C) (Oral)  Resp 18  Ht 5\' 3"  (1.6 m)  Wt 209 lb (94.802 kg)  BMI 37.03 kg/m2  SpO2 100% Physical Exam  Constitutional: She is oriented to person, place, and time. She appears well-developed and well-nourished.  HENT:  Head: Normocephalic and atraumatic.  Cardiovascular: Normal rate.   Pulmonary/Chest: Effort normal.  Abdominal: She exhibits no distension.  Musculoskeletal:  Left ring finger flexed and deviated across palmar 5th  finger, consistent with dislocation. Minimal swelling.   Neurological: She is alert and oriented to person, place, and time.  Skin: Skin is warm and dry.  Psychiatric: She has a normal mood and affect.  Nursing note and vitals reviewed.   ED Course  Procedures (including critical care time) DIAGNOSTIC STUDIES: Oxygen Saturation is 100% on RA, normal by my interpretation.    COORDINATION OF CARE: 9:18 PM- Pt advised of plan for treatment and pt agrees.    Imaging Review Dg Finger Ring Left  05/21/2015   CLINICAL DATA:  Left hand twisting injury.  Deformity of the left ring finger.  EXAM: LEFT RING FINGER 2+V  COMPARISON:  None.  FINDINGS: There is anterior dislocation of the fourth middle phalanx relative to the proximal phalanx. There is no acute fracture. There is no other osseous abnormality.  IMPRESSION: Anterior dislocation of the fourth middle phalanx relative to the proximal phalanx.   Electronically Signed   By: Kathreen Devoid   On: 05/21/2015 20:45   Charlann Lange, PA-C has personally reviewed and evaluated these images as part of her medical decision-making.   EKG Interpretation None     PROCEDURE:  Left ring finger anesthetized via digital block with .5% marcaine with adequate anesthesia. distal portion of finger extended and straightened resulting in realignment of finger PIP joint, c/w successful reduction of dislocation.  MDM   Final diagnoses:  Finger dislocation, initial encounter    Finger splint provided. She will follow up with Hand Orth[facility] for further treatment and evaluation of tendon function.   I personally performed the services described in this documentation, which was scribed in my presence. The recorded information has been reviewed and is accurate.     Charlann Lange, PA-C 05/23/15 0107  Leo Grosser, MD 05/27/15 [phone]

## 2015-05-21 NOTE — ED Notes (Signed)
Pt states that the dog pulled the leash out of her hand and she now has an obvious deformity to her L ring finger. Alert and oriented.

## 2015-05-23 DIAGNOSIS — S63285D Dislocation of proximal interphalangeal joint of left ring finger, subsequent encounter: Secondary | ICD-10-CM | POA: Diagnosis not present

## 2015-05-23 DIAGNOSIS — S63275A Dislocation of unspecified interphalangeal joint of left ring finger, initial encounter: Secondary | ICD-10-CM | POA: Diagnosis not present

## 2015-05-25 DIAGNOSIS — M79645 Pain in left finger(s): Secondary | ICD-10-CM | POA: Diagnosis not present

## 2015-05-29 DIAGNOSIS — S63275D Dislocation of unspecified interphalangeal joint of left ring finger, subsequent encounter: Secondary | ICD-10-CM | POA: Diagnosis not present

## 2015-05-31 DIAGNOSIS — S63285A Dislocation of proximal interphalangeal joint of left ring finger, initial encounter: Secondary | ICD-10-CM | POA: Diagnosis not present

## 2015-06-06 DIAGNOSIS — Z23 Encounter for immunization: Secondary | ICD-10-CM | POA: Diagnosis not present

## 2015-06-06 DIAGNOSIS — S63275D Dislocation of unspecified interphalangeal joint of left ring finger, subsequent encounter: Secondary | ICD-10-CM | POA: Diagnosis not present

## 2015-06-27 DIAGNOSIS — S63275D Dislocation of unspecified interphalangeal joint of left ring finger, subsequent encounter: Secondary | ICD-10-CM | POA: Diagnosis not present

## 2015-07-02 DIAGNOSIS — M25442 Effusion, left hand: Secondary | ICD-10-CM | POA: Diagnosis not present

## 2015-07-02 DIAGNOSIS — M79642 Pain in left hand: Secondary | ICD-10-CM | POA: Diagnosis not present

## 2015-07-02 DIAGNOSIS — S63233D Subluxation of proximal interphalangeal joint of left middle finger, subsequent encounter: Secondary | ICD-10-CM | POA: Diagnosis not present

## 2015-07-02 DIAGNOSIS — M24542 Contracture, left hand: Secondary | ICD-10-CM | POA: Diagnosis not present

## 2015-07-04 DIAGNOSIS — M25442 Effusion, left hand: Secondary | ICD-10-CM | POA: Diagnosis not present

## 2015-07-04 DIAGNOSIS — M24542 Contracture, left hand: Secondary | ICD-10-CM | POA: Diagnosis not present

## 2015-07-04 DIAGNOSIS — M79642 Pain in left hand: Secondary | ICD-10-CM | POA: Diagnosis not present

## 2015-07-04 DIAGNOSIS — S63233D Subluxation of proximal interphalangeal joint of left middle finger, subsequent encounter: Secondary | ICD-10-CM | POA: Diagnosis not present

## 2015-07-08 DIAGNOSIS — M79642 Pain in left hand: Secondary | ICD-10-CM | POA: Diagnosis not present

## 2015-07-08 DIAGNOSIS — M25442 Effusion, left hand: Secondary | ICD-10-CM | POA: Diagnosis not present

## 2015-07-08 DIAGNOSIS — S63233D Subluxation of proximal interphalangeal joint of left middle finger, subsequent encounter: Secondary | ICD-10-CM | POA: Diagnosis not present

## 2015-07-08 DIAGNOSIS — M24542 Contracture, left hand: Secondary | ICD-10-CM | POA: Diagnosis not present

## 2015-07-11 DIAGNOSIS — S63233D Subluxation of proximal interphalangeal joint of left middle finger, subsequent encounter: Secondary | ICD-10-CM | POA: Diagnosis not present

## 2015-07-11 DIAGNOSIS — M25442 Effusion, left hand: Secondary | ICD-10-CM | POA: Diagnosis not present

## 2015-07-11 DIAGNOSIS — M24542 Contracture, left hand: Secondary | ICD-10-CM | POA: Diagnosis not present

## 2015-07-11 DIAGNOSIS — M79642 Pain in left hand: Secondary | ICD-10-CM | POA: Diagnosis not present

## 2015-07-15 DIAGNOSIS — S63233D Subluxation of proximal interphalangeal joint of left middle finger, subsequent encounter: Secondary | ICD-10-CM | POA: Diagnosis not present

## 2015-07-15 DIAGNOSIS — M79642 Pain in left hand: Secondary | ICD-10-CM | POA: Diagnosis not present

## 2015-07-15 DIAGNOSIS — M25442 Effusion, left hand: Secondary | ICD-10-CM | POA: Diagnosis not present

## 2015-07-15 DIAGNOSIS — M24542 Contracture, left hand: Secondary | ICD-10-CM | POA: Diagnosis not present

## 2015-07-17 DIAGNOSIS — M79642 Pain in left hand: Secondary | ICD-10-CM | POA: Diagnosis not present

## 2015-07-17 DIAGNOSIS — M24542 Contracture, left hand: Secondary | ICD-10-CM | POA: Diagnosis not present

## 2015-07-17 DIAGNOSIS — S63233D Subluxation of proximal interphalangeal joint of left middle finger, subsequent encounter: Secondary | ICD-10-CM | POA: Diagnosis not present

## 2015-07-17 DIAGNOSIS — M25442 Effusion, left hand: Secondary | ICD-10-CM | POA: Diagnosis not present

## 2015-07-22 DIAGNOSIS — M24542 Contracture, left hand: Secondary | ICD-10-CM | POA: Diagnosis not present

## 2015-07-22 DIAGNOSIS — M25442 Effusion, left hand: Secondary | ICD-10-CM | POA: Diagnosis not present

## 2015-07-22 DIAGNOSIS — M79642 Pain in left hand: Secondary | ICD-10-CM | POA: Diagnosis not present

## 2015-07-22 DIAGNOSIS — S63233D Subluxation of proximal interphalangeal joint of left middle finger, subsequent encounter: Secondary | ICD-10-CM | POA: Diagnosis not present

## 2015-07-29 DIAGNOSIS — M79642 Pain in left hand: Secondary | ICD-10-CM | POA: Diagnosis not present

## 2015-07-29 DIAGNOSIS — M24542 Contracture, left hand: Secondary | ICD-10-CM | POA: Diagnosis not present

## 2015-07-29 DIAGNOSIS — M25442 Effusion, left hand: Secondary | ICD-10-CM | POA: Diagnosis not present

## 2015-07-29 DIAGNOSIS — S63233D Subluxation of proximal interphalangeal joint of left middle finger, subsequent encounter: Secondary | ICD-10-CM | POA: Diagnosis not present

## 2015-08-01 DIAGNOSIS — M25442 Effusion, left hand: Secondary | ICD-10-CM | POA: Diagnosis not present

## 2015-08-01 DIAGNOSIS — S63233D Subluxation of proximal interphalangeal joint of left middle finger, subsequent encounter: Secondary | ICD-10-CM | POA: Diagnosis not present

## 2015-08-01 DIAGNOSIS — M79642 Pain in left hand: Secondary | ICD-10-CM | POA: Diagnosis not present

## 2015-08-01 DIAGNOSIS — M24542 Contracture, left hand: Secondary | ICD-10-CM | POA: Diagnosis not present

## 2015-08-08 DIAGNOSIS — S63275D Dislocation of unspecified interphalangeal joint of left ring finger, subsequent encounter: Secondary | ICD-10-CM | POA: Diagnosis not present

## 2015-08-28 DIAGNOSIS — S63259D Unspecified dislocation of unspecified finger, subsequent encounter: Secondary | ICD-10-CM | POA: Diagnosis not present

## 2015-08-28 DIAGNOSIS — M79645 Pain in left finger(s): Secondary | ICD-10-CM | POA: Diagnosis not present

## 2015-08-28 DIAGNOSIS — M24542 Contracture, left hand: Secondary | ICD-10-CM | POA: Diagnosis not present

## 2015-09-03 DIAGNOSIS — J3089 Other allergic rhinitis: Secondary | ICD-10-CM | POA: Diagnosis not present

## 2015-09-03 DIAGNOSIS — J4520 Mild intermittent asthma, uncomplicated: Secondary | ICD-10-CM | POA: Diagnosis not present

## 2015-09-03 DIAGNOSIS — J3081 Allergic rhinitis due to animal (cat) (dog) hair and dander: Secondary | ICD-10-CM | POA: Diagnosis not present

## 2015-09-03 DIAGNOSIS — H1045 Other chronic allergic conjunctivitis: Secondary | ICD-10-CM | POA: Diagnosis not present

## 2015-09-04 DIAGNOSIS — M79645 Pain in left finger(s): Secondary | ICD-10-CM | POA: Diagnosis not present

## 2015-09-04 DIAGNOSIS — S63259D Unspecified dislocation of unspecified finger, subsequent encounter: Secondary | ICD-10-CM | POA: Diagnosis not present

## 2015-09-04 DIAGNOSIS — M24542 Contracture, left hand: Secondary | ICD-10-CM | POA: Diagnosis not present

## 2015-09-10 DIAGNOSIS — M24542 Contracture, left hand: Secondary | ICD-10-CM | POA: Diagnosis not present

## 2015-09-10 DIAGNOSIS — M79645 Pain in left finger(s): Secondary | ICD-10-CM | POA: Diagnosis not present

## 2015-09-10 DIAGNOSIS — S63259D Unspecified dislocation of unspecified finger, subsequent encounter: Secondary | ICD-10-CM | POA: Diagnosis not present

## 2015-09-12 DIAGNOSIS — M722 Plantar fascial fibromatosis: Secondary | ICD-10-CM | POA: Diagnosis not present

## 2015-09-12 DIAGNOSIS — S92511A Displaced fracture of proximal phalanx of right lesser toe(s), initial encounter for closed fracture: Secondary | ICD-10-CM | POA: Diagnosis not present

## 2015-09-12 DIAGNOSIS — M1712 Unilateral primary osteoarthritis, left knee: Secondary | ICD-10-CM | POA: Diagnosis not present

## 2015-09-12 DIAGNOSIS — M1711 Unilateral primary osteoarthritis, right knee: Secondary | ICD-10-CM | POA: Diagnosis not present

## 2015-09-16 DIAGNOSIS — J301 Allergic rhinitis due to pollen: Secondary | ICD-10-CM | POA: Diagnosis not present

## 2015-09-16 DIAGNOSIS — J320 Chronic maxillary sinusitis: Secondary | ICD-10-CM | POA: Diagnosis not present

## 2015-09-16 DIAGNOSIS — J3081 Allergic rhinitis due to animal (cat) (dog) hair and dander: Secondary | ICD-10-CM | POA: Diagnosis not present

## 2015-09-16 DIAGNOSIS — H6502 Acute serous otitis media, left ear: Secondary | ICD-10-CM | POA: Diagnosis not present

## 2015-09-16 DIAGNOSIS — H6063 Unspecified chronic otitis externa, bilateral: Secondary | ICD-10-CM | POA: Diagnosis not present

## 2015-09-16 DIAGNOSIS — J370 Chronic laryngitis: Secondary | ICD-10-CM | POA: Diagnosis not present

## 2015-09-16 DIAGNOSIS — J322 Chronic ethmoidal sinusitis: Secondary | ICD-10-CM | POA: Diagnosis not present

## 2015-09-16 DIAGNOSIS — H6122 Impacted cerumen, left ear: Secondary | ICD-10-CM | POA: Diagnosis not present

## 2015-09-18 DIAGNOSIS — S63259D Unspecified dislocation of unspecified finger, subsequent encounter: Secondary | ICD-10-CM | POA: Diagnosis not present

## 2015-09-18 DIAGNOSIS — M24542 Contracture, left hand: Secondary | ICD-10-CM | POA: Diagnosis not present

## 2015-09-19 DIAGNOSIS — S63275D Dislocation of unspecified interphalangeal joint of left ring finger, subsequent encounter: Secondary | ICD-10-CM | POA: Diagnosis not present

## 2015-09-25 DIAGNOSIS — M24542 Contracture, left hand: Secondary | ICD-10-CM | POA: Diagnosis not present

## 2015-09-25 DIAGNOSIS — M79645 Pain in left finger(s): Secondary | ICD-10-CM | POA: Diagnosis not present

## 2015-09-25 DIAGNOSIS — S63259D Unspecified dislocation of unspecified finger, subsequent encounter: Secondary | ICD-10-CM | POA: Diagnosis not present

## 2015-09-26 DIAGNOSIS — J322 Chronic ethmoidal sinusitis: Secondary | ICD-10-CM | POA: Diagnosis not present

## 2015-09-26 DIAGNOSIS — J040 Acute laryngitis: Secondary | ICD-10-CM | POA: Diagnosis not present

## 2015-09-26 DIAGNOSIS — J320 Chronic maxillary sinusitis: Secondary | ICD-10-CM | POA: Diagnosis not present

## 2015-09-30 DIAGNOSIS — Z1389 Encounter for screening for other disorder: Secondary | ICD-10-CM | POA: Diagnosis not present

## 2015-09-30 DIAGNOSIS — M1990 Unspecified osteoarthritis, unspecified site: Secondary | ICD-10-CM | POA: Diagnosis not present

## 2015-09-30 DIAGNOSIS — M519 Unspecified thoracic, thoracolumbar and lumbosacral intervertebral disc disorder: Secondary | ICD-10-CM | POA: Diagnosis not present

## 2015-09-30 DIAGNOSIS — I10 Essential (primary) hypertension: Secondary | ICD-10-CM | POA: Diagnosis not present

## 2015-09-30 DIAGNOSIS — Z0000 Encounter for general adult medical examination without abnormal findings: Secondary | ICD-10-CM | POA: Diagnosis not present

## 2015-09-30 DIAGNOSIS — J309 Allergic rhinitis, unspecified: Secondary | ICD-10-CM | POA: Diagnosis not present

## 2015-09-30 DIAGNOSIS — F325 Major depressive disorder, single episode, in full remission: Secondary | ICD-10-CM | POA: Diagnosis not present

## 2015-09-30 DIAGNOSIS — E782 Mixed hyperlipidemia: Secondary | ICD-10-CM | POA: Diagnosis not present

## 2015-09-30 DIAGNOSIS — G500 Trigeminal neuralgia: Secondary | ICD-10-CM | POA: Diagnosis not present

## 2015-09-30 DIAGNOSIS — R7309 Other abnormal glucose: Secondary | ICD-10-CM | POA: Diagnosis not present

## 2015-10-01 DIAGNOSIS — S63275D Dislocation of unspecified interphalangeal joint of left ring finger, subsequent encounter: Secondary | ICD-10-CM | POA: Diagnosis not present

## 2015-10-01 DIAGNOSIS — M25562 Pain in left knee: Secondary | ICD-10-CM | POA: Diagnosis not present

## 2015-10-03 DIAGNOSIS — M79645 Pain in left finger(s): Secondary | ICD-10-CM | POA: Diagnosis not present

## 2015-10-03 DIAGNOSIS — M24542 Contracture, left hand: Secondary | ICD-10-CM | POA: Diagnosis not present

## 2015-10-03 DIAGNOSIS — S63259D Unspecified dislocation of unspecified finger, subsequent encounter: Secondary | ICD-10-CM | POA: Diagnosis not present

## 2015-10-10 DIAGNOSIS — S63259D Unspecified dislocation of unspecified finger, subsequent encounter: Secondary | ICD-10-CM | POA: Diagnosis not present

## 2015-10-10 DIAGNOSIS — M24542 Contracture, left hand: Secondary | ICD-10-CM | POA: Diagnosis not present

## 2015-10-10 DIAGNOSIS — M79645 Pain in left finger(s): Secondary | ICD-10-CM | POA: Diagnosis not present

## 2015-10-10 DIAGNOSIS — Z0289 Encounter for other administrative examinations: Secondary | ICD-10-CM

## 2015-10-14 ENCOUNTER — Other Ambulatory Visit: Payer: Self-pay

## 2015-10-14 DIAGNOSIS — Z1231 Encounter for screening mammogram for malignant neoplasm of breast: Secondary | ICD-10-CM

## 2015-10-15 ENCOUNTER — Ambulatory Visit

## 2015-10-16 ENCOUNTER — Ambulatory Visit

## 2015-10-16 ENCOUNTER — Ambulatory Visit
Admission: RE | Admit: 2015-10-16 | Discharge: 2015-10-16 | Disposition: A | Discharge status: Home / Self Care | Payer: Medicare Other | Source: Ambulatory Visit

## 2015-10-16 DIAGNOSIS — S63275D Dislocation of unspecified interphalangeal joint of left ring finger, subsequent encounter: Secondary | ICD-10-CM | POA: Diagnosis not present

## 2015-10-16 DIAGNOSIS — Z1231 Encounter for screening mammogram for malignant neoplasm of breast: Secondary | ICD-10-CM | POA: Diagnosis not present

## 2015-10-16 DIAGNOSIS — M24542 Contracture, left hand: Secondary | ICD-10-CM | POA: Diagnosis not present

## 2015-10-23 DIAGNOSIS — Z85828 Personal history of other malignant neoplasm of skin: Secondary | ICD-10-CM | POA: Diagnosis not present

## 2015-10-23 DIAGNOSIS — L570 Actinic keratosis: Secondary | ICD-10-CM | POA: Diagnosis not present

## 2015-10-23 DIAGNOSIS — L720 Epidermal cyst: Secondary | ICD-10-CM | POA: Diagnosis not present

## 2015-10-23 DIAGNOSIS — L821 Other seborrheic keratosis: Secondary | ICD-10-CM | POA: Diagnosis not present

## 2015-10-24 DIAGNOSIS — M79671 Pain in right foot: Secondary | ICD-10-CM | POA: Diagnosis not present

## 2015-11-04 ENCOUNTER — Encounter: Payer: Self-pay | Admitting: Neurology

## 2015-11-04 ENCOUNTER — Ambulatory Visit (INDEPENDENT_AMBULATORY_CARE_PROVIDER_SITE_OTHER): Payer: Medicare Other | Admitting: Neurology

## 2015-11-04 VITALS — BP 134/76 | HR 68 | Ht 63.0 in | Wt 210.0 lb

## 2015-11-04 DIAGNOSIS — G500 Trigeminal neuralgia: Secondary | ICD-10-CM

## 2015-11-04 NOTE — Patient Instructions (Signed)
You may no longer even need Trileptal.  You may continue taking 300mg  at bedtime.  However, you may try just stopping it to see how you do.  If the tingling recurs, just restart 300mg  at bedtime.  The tingling is actually side effect of the gamma knife (not the trigeminal neuralgia)  Follow up in 7 months.

## 2015-11-04 NOTE — Progress Notes (Signed)
NEUROLOGY CONSULTATION NOTE  Shelley Barrera MRN: [id number] DOB: 11/13/1945  Referring provider: Dr. Lysle Rubens Primary care provider: Dr. Lysle Rubens  Reason for consult:  Trigeminal neuralgia  HISTORY OF PRESENT ILLNESS: Shelley Barrera is a 69 year old right-handed female with trigeminal neuralgia, hypertension, GERD, depression, and osteoarthritis who presents for trigeminal neuralgia and balance issues.  History obtained by patient, prior neurologist's notes and PCP note.  She was diagnosed with left-sided trigeminal neuralgia in 1983.  Symptoms were described as shooting pain triggered by talking or light touch such as wind.   She was successfully treated with gamma knife surgery in 1998.  She was symptom-free until 2002, when she had a recurrence and underwent a second gamma knife procedure.  Severe pain resolved but she then developed tingling sensation in the left V2 distribution. Prior medications include Tegretol, Dilantin (toxicity), gabapentin (ineffective) and Darvocet. She had been on oxcarbazepine 300mg  twice daily for several years and did well.  It controls the tingling sensation.  Since the summer, she began to experience unexplained falls.  It would feel like she would just trip and fall forward.  She would feel woozy as well.  It was similar to the symptoms she experienced when she was found to have Dilantin toxicity.  Her last fall was in January.  At that time, her PCP suspected that she may be having side effects to Trileptal.  She reduced the dose to just 300mg  at bedtime and the symptoms resolved.  She is concerned that the Trigeminal neuralgia may recur.  She also has underlying peripheral neuropathy in the feet.  Past workup, including ANA, SPEP, B12, and RA were negative.  It is suspected to be secondary to long-term Dilantin use.  She currently takes oxcarbazepine 300mg  twice daily.  She has osteoarthritis of the knee and multilevel lumbar disc disease.  PAST MEDICAL  HISTORY: Past Medical History  Diagnosis Date  . Hypertension   . Trigeminal neuralgia     PAST SURGICAL HISTORY: No past surgical history on file.  MEDICATIONS: Current Outpatient Prescriptions on File Prior to Visit  Medication Sig Dispense Refill  . HYDROcodone-acetaminophen (NORCO/VICODIN) 5-325 MG tablet Take 1-2 tablets by mouth every 4 (four) hours as needed. 12 tablet 0  . ibuprofen (ADVIL,MOTRIN) 800 MG tablet Take 1 tablet (800 mg total) by mouth 3 (three) times daily. 21 tablet 0  . lansoprazole (PREVACID) 30 MG capsule Take 30 mg by mouth every evening.     . mometasone (NASONEX) 50 MCG/ACT nasal spray Place 2 sprays into the nose daily.    . Oxcarbazepine (TRILEPTAL) 300 MG tablet Take 300 mg by mouth daily.     . valsartan (DIOVAN) 80 MG tablet Take 40 mg by mouth every other day.      No current facility-administered medications on file prior to visit.    ALLERGIES: Allergies  Allergen Reactions  . Demerol [Meperidine Hcl] Other (See Comments)    hallucinations.  . Tegretol [Carbamazepine] Rash    Whole body rash    FAMILY HISTORY: No family history on file.  SOCIAL HISTORY: Social History   Social History  . Marital Status: Widowed    Spouse Name: N/A  . Number of Children: N/A  . Years of Education: N/A   Occupational History  . Not on file.   Social History Main Topics  . Smoking status: Never Smoker   . Smokeless tobacco: Not on file  . Alcohol Use: No  . Drug Use: No  . Sexual  Activity: Not on file   Other Topics Concern  . Not on file   Social History Narrative    REVIEW OF SYSTEMS: Constitutional: No fevers, chills, or sweats, no generalized fatigue, change in appetite Eyes: No visual changes, double vision, eye pain Ear, nose and throat: No hearing loss, ear pain, nasal congestion, sore throat Cardiovascular: No chest pain, palpitations Respiratory:  No shortness of breath at rest or with exertion, wheezes GastrointestinaI: No  nausea, vomiting, diarrhea, abdominal pain, fecal incontinence Genitourinary:  No dysuria, urinary retention or frequency Musculoskeletal:  No neck pain, back pain Integumentary: No rash, pruritus, skin lesions Neurological: as above Psychiatric: No depression, insomnia, anxiety Endocrine: No palpitations, fatigue, diaphoresis, mood swings, change in appetite, change in weight, increased thirst Hematologic/Lymphatic:  No anemia, purpura, petechiae. Allergic/Immunologic: no itchy/runny eyes, nasal congestion, recent allergic reactions, rashes  PHYSICAL EXAM: Filed Vitals:   11/04/15 1015  BP: 134/76  Pulse: 68   General: No acute distress.  Patient appears well-groomed.  Head:  Normocephalic/atraumatic Eyes:  fundi unremarkable, without vessel changes, exudates, hemorrhages or papilledema. Neck: supple, no paraspinal tenderness, full range of motion Back: No paraspinal tenderness Heart: regular rate and rhythm Lungs: Clear to auscultation bilaterally. Vascular: No carotid bruits. Neurological Exam: Mental status: alert and oriented to person, place, and time, recent and remote memory intact, fund of knowledge intact, attention and concentration intact, speech fluent and not dysarthric, language intact. Cranial nerves: CN I: not tested CN II: pupils equal, round and reactive to light, visual fields intact, fundi unremarkable, without vessel changes, exudates, hemorrhages or papilledema. CN III, IV, VI:  full range of motion, no nystagmus, no ptosis CN V: facial sensation intact CN VII: upper and lower face symmetric CN VIII: hearing intact CN IX, X: gag intact, uvula midline CN XI: sternocleidomastoid and trapezius muscles intact CN XII: tongue midline Bulk & Tone: normal, no fasciculations. Motor:  5/5 throughout  Sensation: pinprick sensation intact. Decreased vibration sensation up to the knees. Deep Tendon Reflexes:  2+ in upper extremities, absent lower extremities, toes  downgoing.  Finger to nose testing:  Without dysmetria.  Heel to shin:  Without dysmetria.  Gait:  Normal station and stride.  Able to turn and tandem walk. Romberg negative.  IMPRESSION: History of left-sided trigeminal neuralgia.  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  She will try discontinuing it.  If the tingling recurs, she will restart 300mg  at bedtime.  If she has recurrence of pain and this is not effective, she will call us.  If she restarts Trileptal, will check labs. Follow up in 7 months. 45 minutes spent face to face with patient, over 50% spent discussing management.  Thank you for allowing me to take part in the care of this patient.  Metta Clines, DO  CC:  Wenda Low, MD

## 2015-11-04 NOTE — Progress Notes (Signed)
Chart forwarded.  

## 2016-01-08 DIAGNOSIS — K219 Gastro-esophageal reflux disease without esophagitis: Secondary | ICD-10-CM | POA: Diagnosis not present

## 2016-01-08 DIAGNOSIS — M1990 Unspecified osteoarthritis, unspecified site: Secondary | ICD-10-CM | POA: Diagnosis not present

## 2016-01-08 DIAGNOSIS — F325 Major depressive disorder, single episode, in full remission: Secondary | ICD-10-CM | POA: Diagnosis not present

## 2016-01-08 DIAGNOSIS — Z1389 Encounter for screening for other disorder: Secondary | ICD-10-CM | POA: Diagnosis not present

## 2016-01-08 DIAGNOSIS — I10 Essential (primary) hypertension: Secondary | ICD-10-CM | POA: Diagnosis not present

## 2016-05-01 DIAGNOSIS — H5319 Other subjective visual disturbances: Secondary | ICD-10-CM | POA: Diagnosis not present

## 2016-05-01 DIAGNOSIS — H04123 Dry eye syndrome of bilateral lacrimal glands: Secondary | ICD-10-CM | POA: Diagnosis not present

## 2016-05-01 DIAGNOSIS — H2513 Age-related nuclear cataract, bilateral: Secondary | ICD-10-CM | POA: Diagnosis not present

## 2016-05-01 DIAGNOSIS — H16223 Keratoconjunctivitis sicca, not specified as Sjogren's, bilateral: Secondary | ICD-10-CM | POA: Diagnosis not present

## 2016-06-05 ENCOUNTER — Ambulatory Visit: Payer: Medicare Other | Admitting: Neurology

## 2016-07-14 DIAGNOSIS — H04123 Dry eye syndrome of bilateral lacrimal glands: Secondary | ICD-10-CM | POA: Diagnosis not present

## 2016-07-14 DIAGNOSIS — H16223 Keratoconjunctivitis sicca, not specified as Sjogren's, bilateral: Secondary | ICD-10-CM | POA: Diagnosis not present

## 2016-10-07 DIAGNOSIS — J209 Acute bronchitis, unspecified: Secondary | ICD-10-CM | POA: Diagnosis not present

## 2016-10-07 DIAGNOSIS — R05 Cough: Secondary | ICD-10-CM | POA: Diagnosis not present

## 2016-10-07 DIAGNOSIS — J00 Acute nasopharyngitis [common cold]: Secondary | ICD-10-CM | POA: Diagnosis not present

## 2016-11-12 DIAGNOSIS — R05 Cough: Secondary | ICD-10-CM | POA: Diagnosis not present

## 2016-11-12 DIAGNOSIS — J029 Acute pharyngitis, unspecified: Secondary | ICD-10-CM | POA: Diagnosis not present

## 2016-11-12 DIAGNOSIS — J019 Acute sinusitis, unspecified: Secondary | ICD-10-CM | POA: Diagnosis not present

## 2016-11-27 DIAGNOSIS — R05 Cough: Secondary | ICD-10-CM | POA: Diagnosis not present

## 2016-11-27 DIAGNOSIS — J301 Allergic rhinitis due to pollen: Secondary | ICD-10-CM | POA: Diagnosis not present

## 2016-12-04 DIAGNOSIS — I10 Essential (primary) hypertension: Secondary | ICD-10-CM | POA: Diagnosis not present

## 2016-12-04 DIAGNOSIS — G500 Trigeminal neuralgia: Secondary | ICD-10-CM | POA: Diagnosis not present

## 2016-12-04 DIAGNOSIS — M150 Primary generalized (osteo)arthritis: Secondary | ICD-10-CM | POA: Diagnosis not present

## 2016-12-04 DIAGNOSIS — J45991 Cough variant asthma: Secondary | ICD-10-CM | POA: Diagnosis not present

## 2016-12-04 DIAGNOSIS — J3089 Other allergic rhinitis: Secondary | ICD-10-CM | POA: Diagnosis not present

## 2017-01-01 DIAGNOSIS — E782 Mixed hyperlipidemia: Secondary | ICD-10-CM | POA: Diagnosis not present

## 2017-01-01 DIAGNOSIS — I10 Essential (primary) hypertension: Secondary | ICD-10-CM | POA: Diagnosis not present

## 2017-01-01 DIAGNOSIS — J45991 Cough variant asthma: Secondary | ICD-10-CM | POA: Diagnosis not present

## 2017-01-01 DIAGNOSIS — G2581 Restless legs syndrome: Secondary | ICD-10-CM | POA: Diagnosis not present

## 2017-01-01 DIAGNOSIS — J440 Chronic obstructive pulmonary disease with acute lower respiratory infection: Secondary | ICD-10-CM | POA: Diagnosis not present

## 2017-01-01 DIAGNOSIS — R7309 Other abnormal glucose: Secondary | ICD-10-CM | POA: Diagnosis not present

## 2017-01-01 DIAGNOSIS — E784 Other hyperlipidemia: Secondary | ICD-10-CM | POA: Diagnosis not present

## 2017-01-01 DIAGNOSIS — E559 Vitamin D deficiency, unspecified: Secondary | ICD-10-CM | POA: Diagnosis not present

## 2017-01-07 DIAGNOSIS — I10 Essential (primary) hypertension: Secondary | ICD-10-CM | POA: Diagnosis not present

## 2017-01-07 DIAGNOSIS — E782 Mixed hyperlipidemia: Secondary | ICD-10-CM | POA: Diagnosis not present

## 2017-02-25 DIAGNOSIS — M4726 Other spondylosis with radiculopathy, lumbar region: Secondary | ICD-10-CM | POA: Diagnosis not present

## 2017-02-25 DIAGNOSIS — M545 Low back pain: Secondary | ICD-10-CM | POA: Diagnosis not present

## 2017-02-25 DIAGNOSIS — M4722 Other spondylosis with radiculopathy, cervical region: Secondary | ICD-10-CM | POA: Diagnosis not present

## 2017-03-03 DIAGNOSIS — M4722 Other spondylosis with radiculopathy, cervical region: Secondary | ICD-10-CM | POA: Diagnosis not present

## 2017-03-03 DIAGNOSIS — M4726 Other spondylosis with radiculopathy, lumbar region: Secondary | ICD-10-CM | POA: Diagnosis not present

## 2017-03-03 DIAGNOSIS — M545 Low back pain: Secondary | ICD-10-CM | POA: Diagnosis not present

## 2017-03-09 DIAGNOSIS — M4726 Other spondylosis with radiculopathy, lumbar region: Secondary | ICD-10-CM | POA: Diagnosis not present

## 2017-03-09 DIAGNOSIS — M4722 Other spondylosis with radiculopathy, cervical region: Secondary | ICD-10-CM | POA: Diagnosis not present

## 2017-03-09 DIAGNOSIS — M545 Low back pain: Secondary | ICD-10-CM | POA: Diagnosis not present

## 2017-03-10 DIAGNOSIS — D1730 Benign lipomatous neoplasm of skin and subcutaneous tissue of unspecified sites: Secondary | ICD-10-CM | POA: Diagnosis not present

## 2017-03-11 DIAGNOSIS — M4726 Other spondylosis with radiculopathy, lumbar region: Secondary | ICD-10-CM | POA: Diagnosis not present

## 2017-03-11 DIAGNOSIS — M545 Low back pain: Secondary | ICD-10-CM | POA: Diagnosis not present

## 2017-03-11 DIAGNOSIS — M4722 Other spondylosis with radiculopathy, cervical region: Secondary | ICD-10-CM | POA: Diagnosis not present

## 2017-03-16 DIAGNOSIS — M4726 Other spondylosis with radiculopathy, lumbar region: Secondary | ICD-10-CM | POA: Diagnosis not present

## 2017-03-16 DIAGNOSIS — M4722 Other spondylosis with radiculopathy, cervical region: Secondary | ICD-10-CM | POA: Diagnosis not present

## 2017-03-16 DIAGNOSIS — M545 Low back pain: Secondary | ICD-10-CM | POA: Diagnosis not present

## 2017-03-25 DIAGNOSIS — M4726 Other spondylosis with radiculopathy, lumbar region: Secondary | ICD-10-CM | POA: Diagnosis not present

## 2017-03-25 DIAGNOSIS — M545 Low back pain: Secondary | ICD-10-CM | POA: Diagnosis not present

## 2017-03-25 DIAGNOSIS — M4722 Other spondylosis with radiculopathy, cervical region: Secondary | ICD-10-CM | POA: Diagnosis not present

## 2017-03-30 DIAGNOSIS — M4726 Other spondylosis with radiculopathy, lumbar region: Secondary | ICD-10-CM | POA: Diagnosis not present

## 2017-03-30 DIAGNOSIS — M4722 Other spondylosis with radiculopathy, cervical region: Secondary | ICD-10-CM | POA: Diagnosis not present

## 2017-03-30 DIAGNOSIS — M545 Low back pain: Secondary | ICD-10-CM | POA: Diagnosis not present

## 2017-04-01 DIAGNOSIS — M4722 Other spondylosis with radiculopathy, cervical region: Secondary | ICD-10-CM | POA: Diagnosis not present

## 2017-04-01 DIAGNOSIS — M545 Low back pain: Secondary | ICD-10-CM | POA: Diagnosis not present

## 2017-04-01 DIAGNOSIS — M4726 Other spondylosis with radiculopathy, lumbar region: Secondary | ICD-10-CM | POA: Diagnosis not present

## 2017-04-08 DIAGNOSIS — M545 Low back pain: Secondary | ICD-10-CM | POA: Diagnosis not present

## 2017-04-08 DIAGNOSIS — M4722 Other spondylosis with radiculopathy, cervical region: Secondary | ICD-10-CM | POA: Diagnosis not present

## 2017-04-08 DIAGNOSIS — M4726 Other spondylosis with radiculopathy, lumbar region: Secondary | ICD-10-CM | POA: Diagnosis not present

## 2017-04-12 DIAGNOSIS — I10 Essential (primary) hypertension: Secondary | ICD-10-CM | POA: Diagnosis not present

## 2017-04-12 DIAGNOSIS — M5412 Radiculopathy, cervical region: Secondary | ICD-10-CM | POA: Diagnosis not present

## 2017-04-12 DIAGNOSIS — K219 Gastro-esophageal reflux disease without esophagitis: Secondary | ICD-10-CM | POA: Diagnosis not present

## 2017-04-12 DIAGNOSIS — E782 Mixed hyperlipidemia: Secondary | ICD-10-CM | POA: Diagnosis not present

## 2017-04-21 DIAGNOSIS — M545 Low back pain: Secondary | ICD-10-CM | POA: Diagnosis not present

## 2017-04-21 DIAGNOSIS — M5117 Intervertebral disc disorders with radiculopathy, lumbosacral region: Secondary | ICD-10-CM | POA: Diagnosis not present

## 2017-04-21 DIAGNOSIS — M50323 Other cervical disc degeneration at C6-C7 level: Secondary | ICD-10-CM | POA: Diagnosis not present

## 2017-04-21 DIAGNOSIS — M50322 Other cervical disc degeneration at C5-C6 level: Secondary | ICD-10-CM | POA: Diagnosis not present

## 2017-04-21 DIAGNOSIS — M542 Cervicalgia: Secondary | ICD-10-CM | POA: Diagnosis not present

## 2017-04-21 DIAGNOSIS — M48061 Spinal stenosis, lumbar region without neurogenic claudication: Secondary | ICD-10-CM | POA: Diagnosis not present

## 2017-04-21 DIAGNOSIS — M47812 Spondylosis without myelopathy or radiculopathy, cervical region: Secondary | ICD-10-CM | POA: Diagnosis not present

## 2017-04-21 DIAGNOSIS — M4726 Other spondylosis with radiculopathy, lumbar region: Secondary | ICD-10-CM | POA: Diagnosis not present

## 2017-04-29 DIAGNOSIS — M545 Low back pain: Secondary | ICD-10-CM | POA: Diagnosis not present

## 2017-04-29 DIAGNOSIS — M4726 Other spondylosis with radiculopathy, lumbar region: Secondary | ICD-10-CM | POA: Diagnosis not present

## 2017-04-29 DIAGNOSIS — M4722 Other spondylosis with radiculopathy, cervical region: Secondary | ICD-10-CM | POA: Diagnosis not present

## 2017-06-11 DIAGNOSIS — Z23 Encounter for immunization: Secondary | ICD-10-CM | POA: Diagnosis not present

## 2017-08-20 IMAGING — CR DG FINGER RING 2+V*L*
3 series · 3 of 3 positions shown · non-contrast
Comparison: None.

CLINICAL DATA: Left hand twisting injury. Deformity of the left
ring finger.

EXAM:
LEFT RING FINGER 2+V

[x finger pa left]
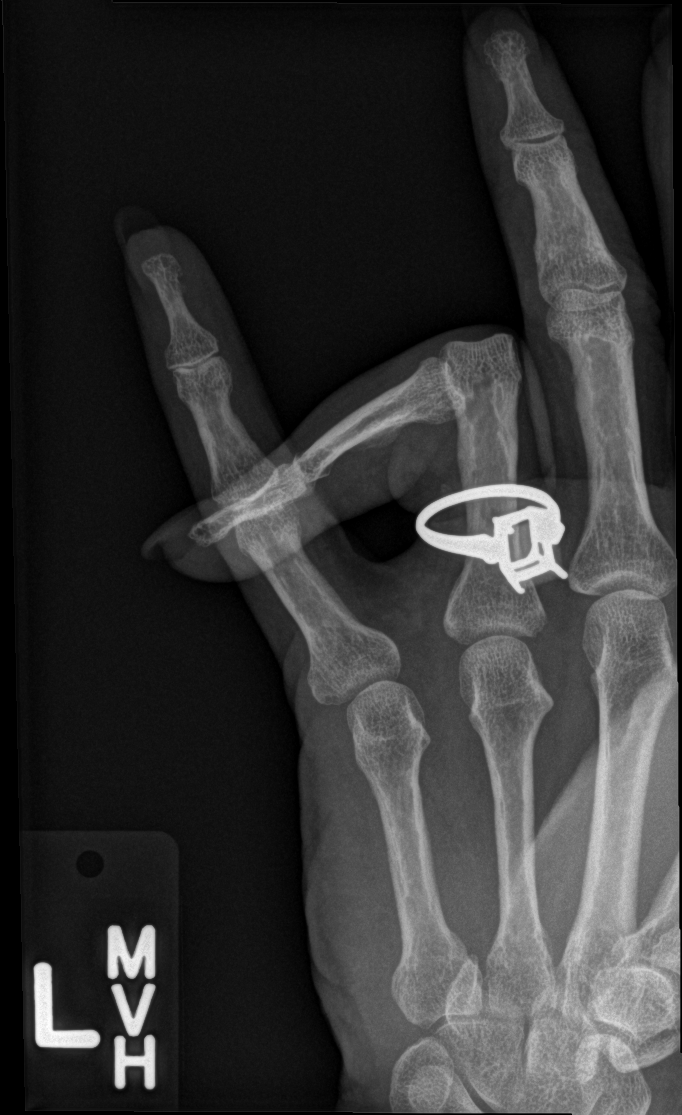

[x finger obl left]
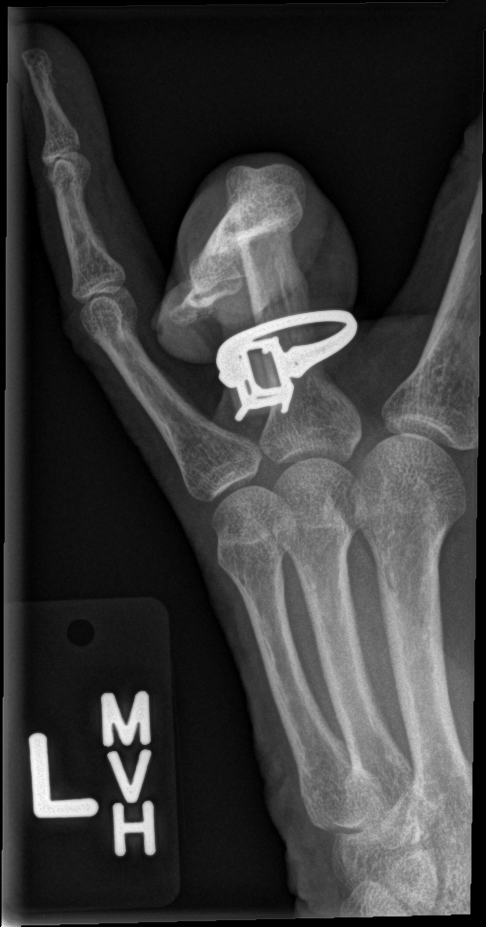

[x finger lat left]
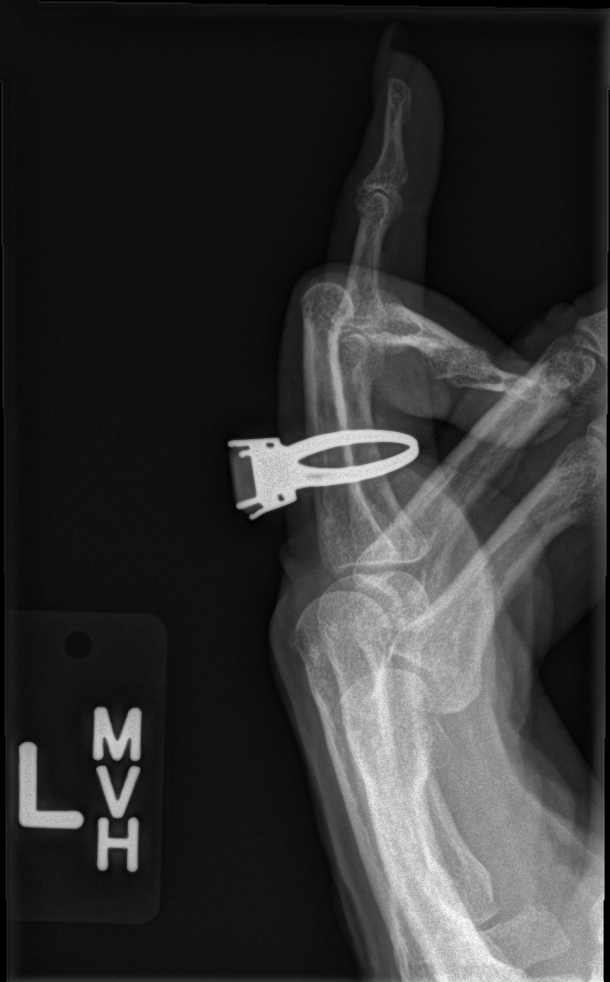

[3 of 3 positions shown; findings below may reference images not displayed]

FINDINGS: There is anterior dislocation of the fourth middle phalanx relative
to the proximal phalanx. There is no acute fracture. There is no
other osseous abnormality.
IMPRESSION: Anterior dislocation of the fourth middle phalanx relative to the
proximal phalanx.
